# Patient Record
Sex: Female | Born: 1965
Health system: Southern US, Community
[De-identification: ages and names within clinical notes are randomized; demographics above are authoritative.]

## PROBLEM LIST (undated history)

## (undated) DIAGNOSIS — K5900 Constipation, unspecified: Secondary | ICD-10-CM

## (undated) DIAGNOSIS — IMO0001 Reserved for inherently not codable concepts without codable children: Secondary | ICD-10-CM

## (undated) DIAGNOSIS — M62838 Other muscle spasm: Secondary | ICD-10-CM

## (undated) DIAGNOSIS — N898 Other specified noninflammatory disorders of vagina: Secondary | ICD-10-CM

## (undated) DIAGNOSIS — R5381 Other malaise: Secondary | ICD-10-CM

## (undated) DIAGNOSIS — K047 Periapical abscess without sinus: Secondary | ICD-10-CM

## (undated) DIAGNOSIS — N946 Dysmenorrhea, unspecified: Secondary | ICD-10-CM

## (undated) DIAGNOSIS — G245 Blepharospasm: Secondary | ICD-10-CM

## (undated) DIAGNOSIS — E785 Hyperlipidemia, unspecified: Secondary | ICD-10-CM

## (undated) DIAGNOSIS — R102 Pelvic and perineal pain: Secondary | ICD-10-CM

## (undated) DIAGNOSIS — R5383 Other fatigue: Secondary | ICD-10-CM

## (undated) HISTORY — DX: Reserved for inherently not codable concepts without codable children: IMO0001

## (undated) HISTORY — DX: Pelvic and perineal pain: R10.2

## (undated) HISTORY — DX: Blepharospasm: G24.5

## (undated) HISTORY — DX: Other specified noninflammatory disorders of vagina: N89.8

## (undated) HISTORY — DX: Hyperlipidemia, unspecified: E78.5

## (undated) HISTORY — DX: Constipation, unspecified: K59.00

## (undated) HISTORY — DX: Other muscle spasm: M62.838

## (undated) HISTORY — DX: Dysmenorrhea, unspecified: N94.6

## (undated) HISTORY — DX: Periapical abscess without sinus: K04.7

## (undated) HISTORY — DX: Other malaise: R53.81

## (undated) HISTORY — DX: Other fatigue: R53.83

---

## 1996-02-05 HISTORY — PX: OTHER SURGICAL HISTORY: SHX169

## 2002-01-25 ENCOUNTER — Other Ambulatory Visit: Admission: RE | Admit: 2002-01-25 | Discharge: 2002-01-25 | Payer: Self-pay | Admitting: Internal Medicine

## 2002-07-01 ENCOUNTER — Other Ambulatory Visit: Admission: RE | Admit: 2002-07-01 | Discharge: 2002-07-01 | Payer: Self-pay | Admitting: Internal Medicine

## 2004-03-01 ENCOUNTER — Ambulatory Visit: Payer: Self-pay | Admitting: Family Medicine

## 2004-07-05 ENCOUNTER — Encounter (INDEPENDENT_AMBULATORY_CARE_PROVIDER_SITE_OTHER): Payer: Self-pay | Admitting: Internal Medicine

## 2004-07-05 HISTORY — PX: OTHER SURGICAL HISTORY: SHX169

## 2004-07-05 LAB — CONVERTED CEMR LAB: Pap Smear: NORMAL

## 2004-07-30 ENCOUNTER — Ambulatory Visit: Payer: Self-pay | Admitting: Family Medicine

## 2004-07-30 ENCOUNTER — Other Ambulatory Visit: Admission: RE | Admit: 2004-07-30 | Discharge: 2004-07-30 | Payer: Self-pay | Admitting: Family Medicine

## 2004-10-20 ENCOUNTER — Emergency Department: Payer: Self-pay | Admitting: Emergency Medicine

## 2004-12-12 ENCOUNTER — Ambulatory Visit: Payer: Self-pay | Admitting: Family Medicine

## 2005-05-22 ENCOUNTER — Ambulatory Visit: Payer: Self-pay | Admitting: Family Medicine

## 2005-06-25 ENCOUNTER — Ambulatory Visit: Payer: Self-pay | Admitting: Family Medicine

## 2006-04-11 ENCOUNTER — Ambulatory Visit: Payer: Self-pay | Admitting: Family Medicine

## 2006-08-20 ENCOUNTER — Ambulatory Visit: Payer: Self-pay | Admitting: Family Medicine

## 2006-08-22 LAB — CONVERTED CEMR LAB
CO2: 30 meq/L (ref 19–32)
Calcium: 9.3 mg/dL (ref 8.4–10.5)
Chloride: 104 meq/L (ref 96–112)
Creatinine, Ser: 0.7 mg/dL (ref 0.4–1.2)
Eosinophils Relative: 0.9 % (ref 0.0–5.0)
GFR calc non Af Amer: 100 mL/min
Glucose, Bld: 126 mg/dL — ABNORMAL HIGH (ref 70–99)
Monocytes Absolute: 0.3 10*3/uL (ref 0.2–0.7)
Neutro Abs: 4.3 10*3/uL (ref 1.4–7.7)
RBC: 4.35 M/uL (ref 3.87–5.11)
RDW: 12.5 % (ref 11.5–14.6)
TSH: 3.73 microintl units/mL (ref 0.35–5.50)
WBC: 7.1 10*3/uL (ref 4.5–10.5)

## 2006-09-02 ENCOUNTER — Encounter: Payer: Self-pay | Admitting: Internal Medicine

## 2006-09-02 DIAGNOSIS — E785 Hyperlipidemia, unspecified: Secondary | ICD-10-CM

## 2006-10-17 ENCOUNTER — Encounter (INDEPENDENT_AMBULATORY_CARE_PROVIDER_SITE_OTHER): Payer: Self-pay | Admitting: Internal Medicine

## 2006-10-17 ENCOUNTER — Ambulatory Visit: Payer: Self-pay | Admitting: Family Medicine

## 2006-10-17 ENCOUNTER — Telehealth (INDEPENDENT_AMBULATORY_CARE_PROVIDER_SITE_OTHER): Payer: Self-pay | Admitting: *Deleted

## 2006-10-17 ENCOUNTER — Other Ambulatory Visit: Admission: RE | Admit: 2006-10-17 | Discharge: 2006-10-17 | Payer: Self-pay | Admitting: Family Medicine

## 2006-10-17 LAB — CONVERTED CEMR LAB

## 2006-10-28 ENCOUNTER — Encounter (INDEPENDENT_AMBULATORY_CARE_PROVIDER_SITE_OTHER): Payer: Self-pay | Admitting: *Deleted

## 2007-10-23 ENCOUNTER — Ambulatory Visit: Payer: Self-pay | Admitting: Family Medicine

## 2007-11-19 ENCOUNTER — Ambulatory Visit: Payer: Self-pay | Admitting: Family Medicine

## 2007-11-19 DIAGNOSIS — K5900 Constipation, unspecified: Secondary | ICD-10-CM | POA: Insufficient documentation

## 2007-11-25 ENCOUNTER — Ambulatory Visit: Payer: Self-pay | Admitting: Family Medicine

## 2007-11-25 ENCOUNTER — Encounter (INDEPENDENT_AMBULATORY_CARE_PROVIDER_SITE_OTHER): Payer: Self-pay | Admitting: Internal Medicine

## 2007-11-25 ENCOUNTER — Other Ambulatory Visit: Admission: RE | Admit: 2007-11-25 | Discharge: 2007-11-25 | Payer: Self-pay | Admitting: Family Medicine

## 2007-11-25 LAB — CONVERTED CEMR LAB: Pap Smear: NORMAL

## 2007-11-27 LAB — CONVERTED CEMR LAB
ALT: 15 units/L (ref 0–35)
AST: 21 units/L (ref 0–37)
BUN: 18 mg/dL (ref 6–23)
Basophils Relative: 0.5 % (ref 0.0–3.0)
GFR calc non Af Amer: 99 mL/min
HCT: 38.3 % (ref 36.0–46.0)
HDL: 39.8 mg/dL (ref >39.0)
Hemoglobin: 13.4 g/dL (ref 12.0–15.0)
Lymphocytes Relative: 28.8 % (ref 12.0–46.0)
MCHC: 34.9 g/dL (ref 30.0–36.0)
MCV: 87.5 fL (ref 78.0–100.0)
Monocytes Absolute: 0.4 10*3/uL (ref 0.1–1.0)
RBC: 4.38 M/uL (ref 3.87–5.11)
Total CHOL/HDL Ratio: 4.8
Triglycerides: 115 mg/dL (ref 0–149)
VLDL: 23 mg/dL (ref 0–40)

## 2007-11-30 ENCOUNTER — Encounter (INDEPENDENT_AMBULATORY_CARE_PROVIDER_SITE_OTHER): Payer: Self-pay | Admitting: *Deleted

## 2008-06-13 ENCOUNTER — Ambulatory Visit: Payer: Self-pay | Admitting: Family Medicine

## 2008-06-13 LAB — CONVERTED CEMR LAB
Glucose, Urine, Semiquant: NEGATIVE
Nitrite: NEGATIVE
Specific Gravity, Urine: 1.02
Whiff Test: NEGATIVE
pH: 5

## 2008-06-16 ENCOUNTER — Ambulatory Visit: Payer: Self-pay | Admitting: Obstetrics and Gynecology

## 2008-06-17 ENCOUNTER — Encounter: Payer: Self-pay | Admitting: Family Medicine

## 2008-06-17 LAB — CONVERTED CEMR LAB
Clue Cells Wet Prep HPF POC: NONE SEEN
Trich, Wet Prep: NONE SEEN
Yeast Wet Prep HPF POC: NONE SEEN

## 2008-07-05 ENCOUNTER — Ambulatory Visit: Payer: Self-pay | Admitting: Obstetrics & Gynecology

## 2008-07-13 ENCOUNTER — Ambulatory Visit: Payer: Self-pay | Admitting: Obstetrics & Gynecology

## 2008-07-29 ENCOUNTER — Encounter: Payer: Self-pay | Admitting: Obstetrics & Gynecology

## 2008-07-29 ENCOUNTER — Ambulatory Visit: Payer: Self-pay | Admitting: Obstetrics & Gynecology

## 2008-07-29 ENCOUNTER — Ambulatory Visit (HOSPITAL_COMMUNITY): Admission: RE | Admit: 2008-07-29 | Discharge: 2008-07-29 | Payer: Self-pay | Admitting: Obstetrics & Gynecology

## 2008-09-16 ENCOUNTER — Inpatient Hospital Stay (HOSPITAL_COMMUNITY): Admission: AD | Admit: 2008-09-16 | Discharge: 2008-09-16 | Payer: Self-pay | Admitting: Obstetrics & Gynecology

## 2008-09-16 ENCOUNTER — Telehealth (INDEPENDENT_AMBULATORY_CARE_PROVIDER_SITE_OTHER): Payer: Self-pay | Admitting: Internal Medicine

## 2008-09-27 ENCOUNTER — Ambulatory Visit: Payer: Self-pay | Admitting: Family Medicine

## 2008-09-28 ENCOUNTER — Encounter: Payer: Self-pay | Admitting: Family Medicine

## 2008-09-28 LAB — CONVERTED CEMR LAB: Yeast Wet Prep HPF POC: NONE SEEN

## 2009-03-16 ENCOUNTER — Telehealth: Payer: Self-pay | Admitting: Family Medicine

## 2009-03-24 ENCOUNTER — Ambulatory Visit: Payer: Self-pay | Admitting: Internal Medicine

## 2009-09-05 ENCOUNTER — Ambulatory Visit: Payer: Self-pay | Admitting: Family Medicine

## 2009-09-05 ENCOUNTER — Telehealth (INDEPENDENT_AMBULATORY_CARE_PROVIDER_SITE_OTHER): Payer: Self-pay | Admitting: *Deleted

## 2009-09-05 LAB — CONVERTED CEMR LAB
ALT: 15 units/L (ref 0–35)
AST: 18 units/L (ref 0–37)
Bilirubin, Direct: 0 mg/dL (ref 0.0–0.3)
CO2: 29 meq/L (ref 19–32)
Calcium: 9.1 mg/dL (ref 8.4–10.5)
Cholesterol: 178 mg/dL (ref 0–200)
Glucose, Bld: 86 mg/dL (ref 70–99)
HDL: 38.2 mg/dL — ABNORMAL LOW (ref >39.00)
LDL Cholesterol: 113 mg/dL — ABNORMAL HIGH (ref 0–99)
Sodium: 141 meq/L (ref 135–145)

## 2009-09-08 ENCOUNTER — Other Ambulatory Visit: Admission: RE | Admit: 2009-09-08 | Discharge: 2009-09-08 | Payer: Self-pay | Admitting: Family Medicine

## 2009-09-08 ENCOUNTER — Ambulatory Visit: Payer: Self-pay | Admitting: Family Medicine

## 2009-09-08 DIAGNOSIS — R5383 Other fatigue: Secondary | ICD-10-CM

## 2009-09-08 DIAGNOSIS — N898 Other specified noninflammatory disorders of vagina: Secondary | ICD-10-CM | POA: Insufficient documentation

## 2009-09-08 DIAGNOSIS — R5381 Other malaise: Secondary | ICD-10-CM

## 2009-09-08 LAB — HM PAP SMEAR

## 2009-09-18 ENCOUNTER — Encounter (INDEPENDENT_AMBULATORY_CARE_PROVIDER_SITE_OTHER): Payer: Self-pay | Admitting: *Deleted

## 2009-09-19 ENCOUNTER — Ambulatory Visit: Payer: Self-pay | Admitting: Family Medicine

## 2009-09-19 ENCOUNTER — Encounter: Payer: Self-pay | Admitting: Family Medicine

## 2009-09-20 LAB — HM MAMMOGRAPHY: HM Mammogram: NORMAL

## 2010-02-16 ENCOUNTER — Ambulatory Visit
Admission: RE | Admit: 2010-02-16 | Discharge: 2010-02-16 | Payer: Self-pay | Source: Home / Self Care | Attending: Family Medicine | Admitting: Family Medicine

## 2010-02-16 DIAGNOSIS — K047 Periapical abscess without sinus: Secondary | ICD-10-CM | POA: Insufficient documentation

## 2010-02-16 DIAGNOSIS — G245 Blepharospasm: Secondary | ICD-10-CM | POA: Insufficient documentation

## 2010-02-16 DIAGNOSIS — M62838 Other muscle spasm: Secondary | ICD-10-CM | POA: Insufficient documentation

## 2010-02-25 ENCOUNTER — Encounter: Payer: Self-pay | Admitting: Family Medicine

## 2010-03-06 NOTE — Assessment & Plan Note (Signed)
Summary: cpx/ transfer from [facility]   Vital Signs:  Patient profile:   45 year old female Height:      59 inches Weight:      126.4 pounds BMI:     25.62 Temp:     98.7 degrees F oral Pulse rate:   84 / minute Pulse rhythm:   regular BP sitting:   110 / 70  (left arm) Cuff size:   regular  Vitals Entered By: Benny Lennert CMA Duncan Dull) (September 08, 2009 3:38 PM)  History of Present Illness: Chief complaint cpx with pap  The patient is here for annual wellness exam and preventative care.      She has the following additional concerns:  When in [place]...4 weeks ago..in area where the is a lot of tuberculsis No cough and no problems breathing. Has recieved vaccine for TB  Recent Mirena IUD placed 5-6 months ago..some vaginal discharge, menses irregular, spotting.  No vaginal itching.  Problems Prior to Update: 1)  Pharyngitis, Viral, Acute  (ICD-462) 2)  Iud  (ICD-V25.1) 3)  Rlq Pain  (ICD-789.03) 4)  Constipation  (ICD-564.00) 5)  Fungal Dermatitis  (ICD-111.9) 6)  Cellulitis, Leg, Right  (ICD-682.6) 7)  Vaginitis  (ICD-616.10) 8)  Well Adult  (ICD-V70.0) 9)  Hyperlipidemia  (ICD-272.4) 10)  Fatigue  (ICD-780.79)  Current Medications (verified): 1)  Daily Multiple Vitamins   Tabs (Multiple Vitamin) .... Take 1 Tablet By Mouth Once A Day 2)  Mirena 20 Mcg/24hr  Iud (Levonorgestrel) .... As Directed  Allergies: 1)  ! * Pain Med Doy Mince [place]  Past History:  Past medical, surgical, family and social histories (including risk factors) reviewed, and no changes noted (except as noted below).  Past Medical History: Reviewed history from 09/02/2006 and no changes required. Hyperlipidemia  Past Surgical History: Reviewed history from 11/25/2007 and no changes required. 6/06   Pelvic x-ray - IUD in place lasik surg both eyes 19998  Family History: Reviewed history from 09/02/2006 and no changes required. Father: Alive 74 ? TB Mother: Died 57, TB Siblings: 2  brothers - L & W                1 sister - L & W CV:  ?  Social History: Reviewed history from 11/25/2007 and no changes required. Never Smoked Alcohol use-no Drug use-no Marital Status: Married x a few months Children: 1 Occupation: Recently moved from [place] from [facility]  Review of Systems       Stress at work.  General:  Complains of fatigue. CV:  Denies chest pain or discomfort. Resp:  Denies shortness of breath. GI:  Denies abdominal pain, bloody stools, and constipation. GU:  Denies dysuria and hematuria. Derm:  Complains of lesion(s); denies rash; bug bites on torso.  Physical Exam  General:  Well-developed,well-nourished,in no acute distress; alert,appropriate and cooperative throughout examination Eyes:  No corneal or conjunctival inflammation noted. EOMI. Perrla. Funduscopic exam benign, without hemorrhages, exudates or papilledema. Vision grossly normal. Ears:  External ear exam shows no significant lesions or deformities.  Otoscopic examination reveals clear canals, tympanic membranes are intact bilaterally without bulging, retraction, inflammation or discharge. Hearing is grossly normal bilaterally. Nose:  External nasal examination shows no deformity or inflammation. Nasal mucosa are pink and moist without lesions or exudates. Mouth:  Oral mucosa and oropharynx without lesions or exudates.  Teeth in good repair. Neck:  No deformities, masses, or tenderness noted. Lungs:  Normal respiratory effort, chest expands symmetrically. Lungs are clear to  auscultation, no crackles or wheezes. Heart:  Normal rate and regular rhythm. S1 and S2 normal without gallop, murmur, click, rub or other extra sounds. Abdomen:  Bowel sounds positive,abdomen soft and non-tender without masses, organomegaly or hernias noted. Genitalia:  Pelvic Exam:        External: normal female genitalia without lesions or masses        Vagina: normal without lesions or masses        Cervix: normal without  lesions or masses, IUD string in place        Adnexa: normal bimanual exam without masses or fullness        Uterus: normal by palpation        Pap smear: performed Pulses:  R and L posterior tibial pulses are full and equal bilaterally  Extremities:  no edema  Skin:  Intact without suspicious lesions or rashes Psych:  Cognition and judgment appear intact. Alert and cooperative with normal attention span and concentration. No apparent delusions, illusions, hallucinations   Impression & Recommendations:  Problem # 1:  WELL ADULT (ICD-V70.0) The patient's preventative maintenance and recommended screening tests for an annual wellness exam were reviewed in full today. Brought up to date unless services declined.  Counselled on the importance of diet, exercise, and its role in overall health and mortality. The patient's FH and SH was reviewed, including their home life, tobacco status, and drug and alcohol status.     Problem # 2:  ROUTINE GYNECOLOGICAL EXAMINATION (ICD-V72.31) PAP pending.   Problem # 3:  HYPERLIPIDEMIA (ICD-272.4) Encouraged exercise, weight loss, healthy eating habits.  Labs Reviewed: SGOT: 18 (09/05/2009)   SGPT: 15 (09/05/2009)   HDL:38.20 (09/05/2009), 39.8 (11/25/2007)  LDL:113 (09/05/2009), 130 (11/25/2007)  Chol:178 (09/05/2009), 193 (11/25/2007)  Trig:136.0 (09/05/2009), 115 (11/25/2007)  Complete Medication List: 1)  Daily Multiple Vitamins Tabs (Multiple vitamin) .... Take 1 tablet by mouth once a day 2)  Mirena 20 Mcg/24hr Iud (Levonorgestrel) .... As directed  Other Orders: Wet Prep (60454UJ) Tdap => 49yrs IM (81191) Admin 1st Vaccine (47829) Admin 1st Vaccine (State) [phone]) Hgb (86578) Radiology Referral (Radiology)  Patient Instructions: 1)  [facility] 2)   [email or web address] disease [facility]...look up any country that you are going to..see what vaccine/protection needed.  3)  Please schedule a follow-up appointment in 1  year.  4)  Work on exercise and healthy eating. 5)     Current Allergies (reviewed today): ! * PAIN MED FRON [place]      Tetanus/Td Vaccine    Vaccine Type: Tdap    Site: left deltoid    Mfr: GlaxoSmithKline    Dose: 0.5 ml    Route: IM    Given by: Benny Lennert CMA (AAMA)    Exp. Date: 08/04/2011    Lot #: [id number]    VIS given: 12/23/06 version given September 08, 2009.   Laboratory Results   Blood Tests   Date/Time Recieved: September 08, 2009 4:43 PM  Date/Time Reported: September 08, 2009 4:43 PM   CBC HGB:  13.5 g/dL   (Normal Range: 13.2-44.0 in Males, 12.0-15.0 in Females)

## 2010-03-06 NOTE — Progress Notes (Signed)
----   Converted from flag ---- ---- 09/04/2009 5:27 PM, Kerby Nora MD wrote: Dx CMEt and LIPIDS Dx 272.0  ---- 09/04/2009 12:02 PM, Liane Comber CMA (AAMA) wrote: Pt is scheduled for cpx labs tomorrow, what labs to draw and dx codes? Thanks Tasha ------------------------------

## 2010-03-06 NOTE — Assessment & Plan Note (Signed)
Summary: DR BEAN PT @ [facility]---SORE SWOLLEN THROAT-STUFF ON TONGUE-FEV...   Vital Signs:  Patient profile:   45 year old female Height:      59 inches (149.86 cm) Weight:      127.0 pounds (57.73 kg) BMI:     25.74 O2 Sat:      97 % on Room air Temp:     97.3 degrees F (36.28 degrees C) oral Pulse rate:   83 / minute BP sitting:   112 / 72  (left arm) Cuff size:   regular  Vitals Entered By: Orlan Leavens (March 24, 2009 1:28 PM)  O2 Flow:  Room air CC: sore throat/ fever x's 2 days Is Patient Diabetic? No Pain Assessment Patient in pain? no        Primary Care Provider:  Shaune Leeks MD  CC:  sore throat/ fever x's 2 days.  History of Present Illness: here today with complaint of pain in mouth and throat. onset of symptoms was 2 days ago. course has been sudden onset and now occurs in constant pattern. problem precipitated by +sick contacts symptom characterized as pain and sore on edge of tounge - progressed into throat problem associated with odynophagia and LGF and malaise but not associated with HA, rash, confusion or cough - no nasal congestion or sputum. symptoms improved by use of nyquil last PM. symptoms worsened with efforts to eat or drink - "can't because it hurts". no prior hx of same symptoms.   Current Medications (verified): 1)  Daily Multiple Vitamins   Tabs (Multiple Vitamin) .... Take 1 Tablet By Mouth Once A Day 2)  Mirena 20 Mcg/24hr  Iud (Levonorgestrel) .... As Directed  Allergies (verified): 1)  ! * Pain Med Doy Mince [place]  Past History:  Past Medical History: Reviewed history from 09/02/2006 and no changes required. Hyperlipidemia  Review of Systems       The patient complains of anorexia.  The patient denies decreased hearing, hoarseness, chest pain, dyspnea on exertion, peripheral edema, and abdominal pain.    Physical Exam  General:  alert, well-developed, well-nourished, and cooperative to examination.   nontoxic - spouse  at side Eyes:  vision grossly intact; pupils equal, round and reactive to light.  conjunctiva and lids normal.    Ears:  normal pinnae bilaterally, without erythema, swelling, or tenderness to palpation. TMs clear, without effusion, or cerumen impaction. Hearing grossly normal bilaterally  Mouth:  teeth and gums in good repair; mucous membranes moist, without ulcers. oropharynx clear without exudate, mod - bright erythema.  +vesicles along left lateral tongue edge and uvula + left oropharynx Neck:  supple, full ROM, no masses, no thyromegaly; no thyroid nodules or tenderness. no JVD or carotid bruits.   Lungs:  normal respiratory effort, no intercostal retractions or use of accessory muscles; normal breath sounds bilaterally - no crackles and no wheezes.    Heart:  normal rate, regular rhythm, no murmur, and no rub. BLE without edema.  Neurologic:  alert & oriented X3 and cranial nerves II-XII symetrically intact.  strength normal in all extremities, sensation intact to light touch, and gait normal. speech fluent without dysarthria or aphasia; follows commands with good comprehension.  Skin:  no rashes, vesicles, ulcers, or erythema. No nodules or irregularity to palpation.    Impression & Recommendations:  Problem # 1:  PHARYNGITIS, VIRAL, ACUTE (ICD-462)  add emperic acyclovir - also viscous lidocaine for control of pain - advised use of tylenol and ibuprofen as needed  for other systemic viral symptoms  to call if worse/unimproved Her updated medication list for this problem includes:    Lidocaine Viscous 2 % Soln (Lidocaine hcl) .Marland KitchenMarland KitchenMarland KitchenMarland Kitchen 10 cc by mouth (swish and gargle - then may swallow or spit) 4x/day as needed for mouth and throat pain  Orders: Prescription Created Electronically [id number])  Complete Medication List: 1)  Daily Multiple Vitamins Tabs (Multiple vitamin) .... Take 1 tablet by mouth once a day 2)  Mirena 20 Mcg/24hr Iud (Levonorgestrel) .... As directed 3)  Acyclovir 400 Mg  Tabs (Acyclovir) .Marland Kitchen.. 1 by mouth 5x/day x 7 days 4)  Lidocaine Viscous 2 % Soln (Lidocaine hcl) .Marland Kitchen.. 10 cc by mouth (swish and gargle - then may swallow or spit) 4x/day as needed for mouth and throat pain  Patient Instructions: 1)  it was good to see you today. 2)  treat viral infection with acyclovir and viscous lidocaine for pain control - your prescriptions have been electronically submitted to your pharmacy. Please take as directed. Contact our office if you believe you're having problems with the medication(s).  3)  Take 650-1000mg  of Tylenol every 4-6 hours as needed for relief of pain or comfort of fever AVOID taking more than 4000mg   in a 24 hour period (can cause liver damage in higher doses). 4)  Take 400-600mg  of Ibuprofen (Advil, Motrin) with food every 4-6 hours as needed for relief of pain or comfort of fever. 5)  Drink clear liquids only for the next 24 hours, then slowly add other liquids and food as you  tolerate them. 6)  Please schedule a follow-up appointment as needed. Prescriptions: LIDOCAINE VISCOUS 2 % SOLN (LIDOCAINE HCL) 10 cc by mouth (swish and gargle - then may swallow or spit) 4x/day as needed for mouth and throat pain  #200cc x 0   Entered and Authorized by:   Newt Lukes MD   Signed by:   Newt Lukes MD on 03/24/2009   Method used:   Electronically to        [facility] [address] (retail)       [address]       [place], [place]  [address]       Ph: [phone]       Fax: [phone]   RxID:   [id number] ACYCLOVIR 400 MG TABS (ACYCLOVIR) 1 by mouth 5x/day x 7 days  #35 x 0   Entered and Authorized by:   Newt Lukes MD   Signed by:   Newt Lukes MD on 03/24/2009   Method used:   Electronically to        [facility] [address] (retail)       [address]       [place], [place]  [address]       Ph: [phone]       Fax: [phone]   RxID:    [id number]

## 2010-03-06 NOTE — Progress Notes (Signed)
Summary: wants referral to massage therapist  Phone Note Call from Patient Call back at [phone]   Caller: Patient Summary of Call: Pt wants referral to a massage therapist for her back and shoulder pain.  She prefers to go to [place], needs this to be covered by her insurance. Initial call taken by: Lowella Petties CMA,  March 16, 2009 3:38 PM  Follow-up for Phone Call        Will need to be seen for this here. May need to try physical therapy as massage therapy often not covered. Follow-up by: Shaune Leeks MD,  March 16, 2009 3:55 PM  Additional Follow-up for Phone Call Additional follow up Details #1::        Advised pt, she declined appt, said she would get massage therapy on her own. Additional Follow-up by: Lowella Petties CMA,  March 16, 2009 4:24 PM

## 2010-03-06 NOTE — Letter (Signed)
Summary: Results Follow up Letter  [place] [facility]  [address] [place], [place] [address]   Phone: [phone]  Fax: [phone]    09/18/2009 MRN: [id number]     Jennifer Mendez [address] [place], [place]  [address]     Dear [name],  The following are the results of your recent test(s):  Test         Result    Pap Smear:        Normal __x___  Not Normal _____ Comments:Repeat in 1 year ______________________________________________________ Cholesterol: LDL(Bad cholesterol):         Your goal is less than:         HDL (Good cholesterol):       Your goal is more than: Comments:  ______________________________________________________ Mammogram:        Normal _____  Not Normal _____ Comments:  ___________________________________________________________________ Hemoccult:        Normal _____  Not normal _______ Comments:    _____________________________________________________________________ Other Tests:    We routinely do not discuss normal results over the telephone.  If you desire a copy of the results, or you have any questions about this information we can discuss them at your next office visit.   Sincerely,  Kerby Nora MD

## 2010-03-08 NOTE — Assessment & Plan Note (Signed)
Summary: Problem with left eye ? nerve/spasm   Vital Signs:  Patient profile:   45 year old female Height:      59 inches Weight:      133.25 pounds BMI:     27.01 Temp:     98.4 degrees F oral Pulse rate:   84 / minute Pulse rhythm:   regular BP sitting:   100 / 60  (left arm) Cuff size:   regular  Vitals Entered By: Benny Lennert CMA Duncan Dull) (February 16, 2010 3:16 PM)  History of Present Illness: Chief complaint Problem with left eye spasm or nerve  left eye in last 4 weeks... has noted twithcing of lower lid, occ upper lid. Using computer daily... works on [employer]... on 10 hours a day. Fatgiue, no headache.  Swollen gum in right lower jaw x 3-4 days... moderately painful. 10 years ago had teeth removed there. Last dentist visit 3 months ago...had good exam and cleaning, nml X-ray .  No fever.Marland Kitchen occ hotat night.   Father passed away 2-3 days ago.     Right trapezius ttp, no shoulder or neck pain. No weakness, no tingling.   Problems Prior to Update: 1)  Routine Gynecological Examination  (ICD-V72.31) 2)  Other Screening Mammogram  (ICD-V76.12) 3)  Fatigue  (ICD-780.79) 4)  Vaginal Discharge  (ICD-623.5) 5)  Iud  (ICD-V25.1) 6)  Constipation  (ICD-564.00) 7)  Well Adult  (ICD-V70.0) 8)  Hyperlipidemia  (ICD-272.4)  Current Medications (verified): 1)  Daily Multiple Vitamins   Tabs (Multiple Vitamin) .... Take 1 Tablet By Mouth Once A Day 2)  Mirena 20 Mcg/24hr  Iud (Levonorgestrel) .... As Directed  Allergies: 1)  ! * Pain Med Doy Mince [place]  Past History:  Past medical, surgical, family and social histories (including risk factors) reviewed, and no changes noted (except as noted below).  Past Medical History: Reviewed history from 09/02/2006 and no changes required. Hyperlipidemia  Past Surgical History: Reviewed history from 11/25/2007 and no changes required. 6/06   Pelvic x-ray - IUD in place lasik surg both eyes 19998  Family History: Reviewed  history from 09/02/2006 and no changes required. Father: Alive 75 ? TB Mother: Died 84, TB Siblings: 2 brothers - L & W                1 sister - L & W CV:  ?  Social History: Reviewed history from 11/25/2007 and no changes required. Never Smoked Alcohol use-no Drug use-no Marital Status: Married x a few months Children: 1 Occupation: Recently moved from [place] from [employer]  Physical Exam  General:  Well-developed,well-nourished,in no acute distress; alert,appropriate and cooperative throughout examination Head:  Normocephalic and atraumatic without obvious abnormalities. No apparent alopecia or balding. Eyes:  No corneal or conjunctival inflammation noted. EOMI. Perrla. Funduscopic exam benign, without hemorrhages, exudates or papilledema. Vision grossly normal.Left eyelid spasming. Ears:  External ear exam shows no significant lesions or deformities.  Otoscopic examination reveals clear canals, tympanic membranes are intact bilaterally without bulging, retraction, inflammation or discharge. Hearing is grossly normal bilaterally. Nose:  External nasal examination shows no deformity or inflammation. Nasal mucosa are pink and moist without lesions or exudates. Mouth:  Oral mucosa and oropharynx without lesions or exudates.  right posterior lower gum.. tender, erythema, possible abcess.  Neck:  No carotid bruit or thyromegaly . Lungs:  Normal respiratory effort, chest expands symmetrically. Lungs are clear to auscultation, no crackles or wheezes. Heart:  Normal rate and regular rhythm. S1 and S2 normal  without gallop, murmur, click, rub or other extra sounds. Pulses:  R and L posterior tibial pulses are full and equal bilaterally  Extremities:  no edema    Impression & Recommendations:  Problem # 1:  BLEPHAROSPASM (ICD-333.81) Liekly due to eye strain from over use of computers.  If not improveing have eye exam by eye MD.   Problem # 2:  MUSCLE SPASM, TRAPEZIUS MUSCLE, RIGHT  (ICD-728.85) NSAIDS, heat, exercises.   Problem # 3:  ABSCESS, TOOTH (ICD-522.5) Treat with antibiotics.Marland Kitchen if not improving  see dentist.   Complete Medication List: 1)  Daily Multiple Vitamins Tabs (Multiple vitamin) .... Take 1 tablet by mouth once a day 2)  Mirena 20 Mcg/24hr Iud (Levonorgestrel) .... As directed 3)  Amoxicillin 500 Mg Caps (Amoxicillin) .... 2 tab by mouth two times a day x 10 days  Patient Instructions: 1)  Heat on neck, ibuprofen 800 mg every 8 hours for pain. 2)  Neck stretching exercsies. Massage neck/shoulder. 3)   Decrease computer use as able.  See eye MD if eye spasm continuing for eye exam.  4)  Complete antibitoics for presumed  tooth abcess.  Prescriptions: AMOXICILLIN 500 MG CAPS (AMOXICILLIN) 2 tab by mouth two times a day x 10 days  #40 x 0   Entered and Authorized by:   Kerby Nora MD   Signed by:   Kerby Nora MD on 02/16/2010   Method used:   Electronically to        [facility] [address] (retail)       [address]       [place], [place]  [address]       Ph: [phone]       Fax: [phone]   RxID:   [id number]    Orders Added: 1)  Est. Patient Level IV [66440]    Current Allergies (reviewed today): ! * PAIN MED FRON [place]

## 2010-05-14 LAB — PREGNANCY, URINE: Preg Test, Ur: NEGATIVE

## 2010-05-14 LAB — CBC
HCT: 38.8 % (ref 36.0–46.0)
MCV: 87.9 fL (ref 78.0–100.0)
Platelets: 150 10*3/uL (ref 150–400)
RBC: 4.42 MIL/uL (ref 3.87–5.11)
WBC: 6.9 10*3/uL (ref 4.0–10.5)

## 2010-05-17 ENCOUNTER — Ambulatory Visit (INDEPENDENT_AMBULATORY_CARE_PROVIDER_SITE_OTHER): Payer: Commercial Indemnity | Admitting: Obstetrics & Gynecology

## 2010-05-17 DIAGNOSIS — N946 Dysmenorrhea, unspecified: Secondary | ICD-10-CM

## 2010-05-17 DIAGNOSIS — Z30431 Encounter for routine checking of intrauterine contraceptive device: Secondary | ICD-10-CM

## 2010-05-17 DIAGNOSIS — N949 Unspecified condition associated with female genital organs and menstrual cycle: Secondary | ICD-10-CM

## 2010-05-17 NOTE — Assessment & Plan Note (Signed)
NAME:  HIYAB, NHEM NO.:  [id number]  MEDICAL RECORD NO.:  [id number]           PATIENT TYPE:  LOCATION:  [facility]           FACILITY:  PHYSICIAN:  Catalina Antigua, MD          DATE OF BIRTH:  DATE OF SERVICE:  05/17/2010                                 CLINIC NOTE  This is a 45 year old G3, P1-0-2-1 with a history of dysmenorrhea and pelvic pain who is status post IUD placement in 2010, who presents today for evaluation for 3-week vaginal bleeding status post IUD placement in 2010, as well as the presence of vaginal odor.  The patient states that since she has had the IUD placed, she has experienced a 3-week period which is light for which she only requires to wear a panty liner, but is not happy with the fact that she experiences vaginal bleeding for such a prolonged period of time.  She also reports the association of a vaginal odor despite her frequent cleansing.  The patient reports significant improvement in her pelvic pain and is only concerned of her prolonged vaginal bleeding.  PAST MEDICAL HISTORY:  She denies.  PAST SURGICAL HISTORY:  She denies.  PAST OBSTETRICAL HISTORY:  She has had 1 C-section.  PAST GYN HISTORY:  She denies any cyst, fibroids, or history of abnormal Pap smear.  SOCIAL HISTORY:  She denies any drinking, smoking, or the use of illicit drugs.  FAMILY HISTORY:  Significant for hypertension.  REVIEW OF SYSTEMS:  Otherwise, within normal limits.  PHYSICAL EXAMINATION:  VITAL SIGNS:  Her blood pressure is 118/88, pulse of 84, weight of 130 pounds. ABDOMEN:  Soft, nontender, nondistended. PELVIC:  She had normal vaginal mucosa and appearing cervix.  IUD strings were visualized at the external os.  No abnormal bleeding or discharge.  ASSESSMENT AND PLAN:  This is a 45 year old G3, P1-0-2-1, with IUD in place, who presents today for evaluation of 3-week monthly periods.  The patient states that her light bleeding  pattern which requires wearing a panty liner has a normal side effects at the depot of the IUD, however, we will give her Depo-Provera today to see if that will alleviate her symptoms.  A wet prep was also collected today.  The patient will be contacted with any abnormal results.  The patient will return in 4 months for followup on her vaginal bleeding.  An ultrasound was also ordered to confirm appropriate placement of the IUD.          ______________________________ Catalina Antigua, MD    PC/MEDQ  D:  05/17/2010  T:  05/17/2010  Job:  [id number]

## 2010-06-06 ENCOUNTER — Encounter: Payer: Self-pay | Admitting: Family Medicine

## 2010-06-08 ENCOUNTER — Encounter: Payer: Self-pay | Admitting: Family Medicine

## 2010-06-08 ENCOUNTER — Ambulatory Visit (INDEPENDENT_AMBULATORY_CARE_PROVIDER_SITE_OTHER): Payer: Commercial Indemnity | Admitting: Family Medicine

## 2010-06-08 DIAGNOSIS — M25511 Pain in right shoulder: Secondary | ICD-10-CM

## 2010-06-08 DIAGNOSIS — M25519 Pain in unspecified shoulder: Secondary | ICD-10-CM

## 2010-06-08 DIAGNOSIS — R229 Localized swelling, mass and lump, unspecified: Secondary | ICD-10-CM

## 2010-06-08 DIAGNOSIS — D492 Neoplasm of unspecified behavior of bone, soft tissue, and skin: Secondary | ICD-10-CM

## 2010-06-08 MED ORDER — CYCLOBENZAPRINE HCL 5 MG PO TABS: 5.0000 mg | ORAL_TABLET | Freq: Two times a day (BID) | ORAL | Status: DC | PRN

## 2010-06-08 MED ORDER — NAPROXEN 500 MG PO TABS: 500.0000 mg | ORAL_TABLET | Freq: Two times a day (BID) | ORAL | Status: DC

## 2010-06-08 NOTE — Progress Notes (Signed)
  Subjective:    Patient ID: Jennifer Mendez, female    DOB: 03-05-1965, 45 y.o.   MRN: [id number]  HPI CC: check head and shoulder  2 issues today.  Check spot on head x 7-8 mo, growing.  Bleeds when scratched, not painful.  Shoulder pain - almost 2 years now, getting worse.  Worse with travel/sitting in flight.  Pain at shoulder blade.  Heating pad helps pain but only temporary.  Also tried tylenol/alleve.  Makes phone calls all day, uses phone on right side.    Also feels cold continuously, making shoulder worse.  + R upper shoulder feels numb occasionally  No pain into arm or up neck.  No weakness.  Denies injury/trauma.  Review of Systems Per HPI    Objective:   Physical Exam  Constitutional: She is oriented to person, place, and time. She appears well-developed and well-nourished. No distress.  HENT:  Head: Normocephalic and atraumatic.  Mouth/Throat: Oropharynx is clear and moist. No oropharyngeal exudate.  Musculoskeletal:       FROM shoulders.  FROM neck.  Neg spurling.  No midline spine tenderness. R scapula doesn't move as well as L scapula. Tender to palpation rhomboid and trapezius muscles on R. Weakness 2/2 pain with testing of SITS on right esp supraspinatus.  Neurological: She is alert and oriented to person, place, and time. No sensory deficit.  Skin:       Exophytic growth L parietal scalp, nontender.          Assessment & Plan:

## 2010-06-08 NOTE — Assessment & Plan Note (Signed)
Some scapular dyskinesis along with rhomboid strain and likely RTC tendinopathy. Treat with NSAIDs, flexeril, rest, ice/heat.  Stretching/strengthening exercises as well.   Referral to PT per patient preference.

## 2010-06-08 NOTE — Patient Instructions (Signed)
Return at your convenience for office visit to remove growth. For shoulder - take naprosyn twice daily with food for 7 days as well as flexeril muscle relaxant.  Referral to physical therapy for further management.  Sounds like either rhomboid muscle strain or rotator cuff tendinopathy.  Do stretching exercises provided today.  Pass by Marion's office for referral to PT. For throat - salt water gargles.  If not better, notify us.

## 2010-06-08 NOTE — Assessment & Plan Note (Signed)
Unclear etiology.  Not consistent with pyogenic granuloma.  ? dermatofibroma Return Monday for excisional punch biopsy.  Small enough for likely 4mm.

## 2010-06-11 ENCOUNTER — Ambulatory Visit (INDEPENDENT_AMBULATORY_CARE_PROVIDER_SITE_OTHER): Payer: Commercial Indemnity | Admitting: Family Medicine

## 2010-06-11 ENCOUNTER — Encounter: Payer: Self-pay | Admitting: Family Medicine

## 2010-06-11 ENCOUNTER — Other Ambulatory Visit: Payer: Self-pay | Admitting: Family Medicine

## 2010-06-11 VITALS — BP 110/78 | HR 74 | Temp 98.5°F

## 2010-06-11 DIAGNOSIS — R229 Localized swelling, mass and lump, unspecified: Secondary | ICD-10-CM

## 2010-06-11 DIAGNOSIS — D492 Neoplasm of unspecified behavior of bone, soft tissue, and skin: Secondary | ICD-10-CM

## 2010-06-11 NOTE — Patient Instructions (Signed)
Biopsy Information A biopsy is the removal of tissue from the body. The tissue is looked at under a microscope to see what is wrong. Tissue can be removed in several different ways. A surgical biopsy means a piece of tissue is removed by minor surgery. Stitches may be used to close the skin. Special needles and other instruments can also be used to get small pieces of tissue.  Most biopsy specimens are examined by specialists. This is done after the tissue is specially prepared. Small slices are then placed on slides and looked at under a microscope. Most of the time, this process takes 3 days to complete the tissue preparation and exam. Pathologists (the specialist) are usually able to give a diagnosis after looking at the slides. Call your caregiver if you have questions about your biopsy procedure or the results. Make sure you know when and how you are supposed to get your results. Do not assume everything is fine if you do not hear from your caregiver. Document Released: 01/21/2005 Document Re-Released: 08/04/2006 [facility] Patient Information 2011 [place], [place].  If you haven't heard from [place] by next week ,call us. Keep area clean and dry, may use abx ointment on lesion. Watch for worsening pain or draining pus. If that happens, return to be seen.

## 2010-06-11 NOTE — Progress Notes (Signed)
  Subjective:    Patient ID: Jennifer Mendez, female    DOB: 10/03/1965, 45 y.o.   MRN: [id number]  HPI Here for biopsy of skin lesion (see last week visit for specifics).  Indications: bleeds, sometimes tender, uncomfortable.  Changing.  Present for several years.   Review of Systems     Objective:   Physical Exam Exophytic crusty lesion L parietal scalp measuring 5 mm in diameter    IC obtained and in chart.  Area cleaned with EtOH.  1cc buffered lidocaine with epi/bicarb used to numb area.  Growth too large for 4mm punch biopsy, no 5mm available so excised with shave biopsy.  Bleeding controlled with silver nitrate stick.  Dressed with abx ointment and bandaid.  Wound care information provided.   Assessment & Plan:

## 2010-06-11 NOTE — Assessment & Plan Note (Signed)
Unclear etiology.  ? pyogenic granuloma.  ? dermatofibroma Removed today via shave biopsy.  Await pathology.

## 2010-06-15 ENCOUNTER — Encounter: Payer: Self-pay | Admitting: Family Medicine

## 2010-06-19 NOTE — Op Note (Signed)
NAME:  Jennifer, Mendez NO.:  [id number]   MEDICAL RECORD NO.:  [id number]          PATIENT TYPE:  POB   LOCATION:  [facility]                          FACILITY:  [facility]   PHYSICIAN:  Horton Chin, MD DATE OF BIRTH:  1966-01-22   DATE OF PROCEDURE:  07/29/2008  DATE OF DISCHARGE:  07/13/2008                               OPERATIVE REPORT   PREOPERATIVE DIAGNOSIS:  Retained ring intrauterine device for 14 years,  desires further contraception.   POSTOPERATIVE DIAGNOSIS:  Retained ring intrauterine device for 14  years, desires further contraception.   PROCEDURE:  Operative hysteroscopy, removal of ring intrauterine device,  and insertion of a Mirena intrauterine device.   SURGEON:  Horton Chin, MD   ANESTHESIOLOGIST:  Angelica Pou, MD   ANESTHESIA:  General LMA.   INDICATIONS:  The patient is a 45 year old gravida 3, para 1-0-2-1, who  had a ring IUD inserted after her last delivery 14 years ago.  The  patient has increasing dysmenorrhea and attributed this to this ring  IUD.  An attempt was made to remove the IUD in the office after the  patient used Cytotec to aid with cervical dilatation, however, this was  not possible as the IUD was palpated to be embedded into the  endometrium.  Given the difficultyduring the attempt in the office, the  patient was counseled about removal in the operating room with the help  of hysteroscopy.  The risks of surgery were explained to the patient  including bleeding, infection, injury to surrounding organs, need for  additional procedures, and written informed consent was obtained.  Of  note, the patient did desire further contraception and wanted insertion  of a Mirena IUD at the same time.  The risk of the Mirena IUD were also  explained to the patient including the efficacy of 99.8% and increased  risk of ectopic gestation if pregnancy does occur in the event of  failure.  The patient was told that the Mirena  IUD will remain in place  for 5 years.   FINDINGS:  Embedded ring IUD high in the fundus.  Normal endometrial  cavity.  Normal ostia bilaterally.  The uterine cavity sounded to 8 cm.  Normal overall pelvic examination.  After the ring IUD was removed, the  Mirena IUD was reinserted.  Strings were cut to 3 cm outside external  os.   SPECIMENS:  None.   IV FLUIDS:  600 mL of lactated Ringer's.   SORBITOL DEFICIT:  60 mL.   ESTIMATED BLOOD LOSS:  Minimal.   PROCEDURE DETAILS:  The patient was taken to the operating room, where  general analgesia in the form of LMA was administered and found to be  adequate.  She was then placed in the dorsal lithotomy position and  prepped and draped in a sterile manner.  Her bladder was straight  catheterized for an unmeasured amount of clear yellow urine.  Attention  was then turned to the patient's pelvis, where a vaginal speculum was  placed and a tenaculum was placed in the anterior lip of the cervix.  A  paracervical block using 30 mL of 0.25% Marcaine was administered and  metal dilators were used to dilate the cervix to accommodate the  operative hysteroscope.  The operative hysteroscope was inserted into  the uterine cavity using sorbitol as a distention medium.  The uterine  cavity was carefully surveyed and the ring IUD was noted to be in the  top part of the fundus.  At this point, a grasper was placed through the  operative port and used to grasp the ring IUD.  Using the grasper, the  ring IUD was able to be removed from the surrounding endometrium and  myometrium and able to be removed from the uterine cavity.  There was  minimal amount of bleeding noted.  The uterine cavity was then  resurveyed and the ring was noted to be completely removed.  The ring  was also inspected on the operative table and noted to be intact but was  sent over to pathology for identification.  The uterus was cleared of  all the sorbitol and then at this point  was sounded to 8 cm in length.  The Mirena IUD was then inserted in the usual fashion.  This was an  uncomplicated Mirena IUD insertion and the IUD strings were cut to about  3 cm outside the external os.  Overall, good hemostasis was noted.  The  tenaculum was removed from the patient's cervix and all instruments were  removed from the patient's pelvis.  The patient tolerated the procedure  well.  Sponge and instrument counts were correct x2.  She was taken to  the recovery room awake, extubated, and in stable condition.   FOLLOWUP PLANS:  The patient was given prescriptions for Percocet,  Ibuprofen, and Colace, and was instructed to call the [facility] to make an appointment in about 3 - 4 weeks.  Routine  postoperative instructions were given to the patient and she was told to  call to [facility] or come to the [facility] for any further postoperative  concerns.      Horton Chin, MD  Electronically Signed     UAA/MEDQ  D:  07/29/2008  T:  07/30/2008  Job:  [id number]

## 2010-06-19 NOTE — Assessment & Plan Note (Signed)
NAME:  Jennifer Mendez, Jennifer Mendez NO.:  [id number]   MEDICAL RECORD NO.:  [id number]          PATIENT TYPE:  POB   LOCATION:  [facility]         FACILITY:  [facility]   PHYSICIAN:  Johnella Moloney, MD        DATE OF BIRTH:  03-24-1965   DATE OF SERVICE:  07/13/2008                                  CLINIC NOTE   CHIEF COMPLAINT:  Retained ring IUD for 14 years.   HISTORY OF PRESENT ILLNESS:  The patient is a 45 year old, gravida 3,  para 1-0-2-1, who had a ring IUD inserted after her last delivery 14  years ago.  The patient has had increasing dysmenorrhea and is  interested in removing the IUD.  At her last visit, the patient was  given a prescription for Cytotec 200 mcg and told to place per vagina  for 2 nights prior to the appointment today for an attempt to remove the  IUD.  Today she reports that she used the Cytotec as prescribed and she  also reports no other gynecologic complaints.   PAST MEDICAL HISTORY:  Hypercholesterinemia.   PAST SURGICAL HISTORY:  Cesarean section.   PAST OB/GYN HISTORY:  Regular menstrual periods with associated  dysmenorrhea lasted for 5 days.  The patient has a ring IUD in place.  Her last Pap smear was in December 2009 and was normal.  She denies any  cervical dysplasia.  The patient has never had a mammogram.   MEDICATIONS:  None, just IUD in place.   ALLERGIES:  No known drug allergies.  The patient is not allergic to  LATEX.   SOCIAL HISTORY:  The patient lives with her family.  She denies any  habits.  Denies any past or current history of abuse.   FAMILY HISTORY:  Notable for heart disease.  She denies any gynecologic,  colon, or breast cancer.   PHYSICAL EXAMINATION:  VITAL SIGNS:  Pulse 68, blood pressure 106/72,  weight 226 pounds, height 5 feet.  GENERAL:  No apparent distress.  ABDOMEN:  Soft, nontender, and nondistended.  PELVIC:  Normal external female genitalia.  Pink, well rugated vagina.  Normal discharge.   Normal cervical contour.   At times of IUD removal, the patient's cervix was swabbed with Betadine  x2 and a tenaculum was placed in the anterior lip.  A paracervical block  using 1% Xylocaine with epinephrine was administered.  A total of 20 mLs  was used.  Her cervix was dilated with disposable dilators and Kelly  forcep was introduced into the uterine cavity.  The ring forceps was  able to be palpated with a Tresa Endo, but was unable to be grasped after  multiple attempts.  On palpation of the IUD within the endometrial  cavity, it seems like that was pretty embedded into the myometrium and  could not be fully grasped with a Kelly forceps.  After further  attempts, the decision was made to stop the procedure.  The patient had  a minimal blood loss and was not uncomfortable during the procedure.  All instruments were then removed from the patient's vagina.   ASSESSMENT AND PLAN:  The patient is a 45 year old, gravida 3, para  1-0-  2-1, who is here today for an intrauterine (contraceptive) device  removal.  The patient does have a ring intrauterine (contraceptive)  device that has been in place for 14 years and this is concerning for  possible embedment into the myometrium.  This was unable to be removed  in office.  The patient was told that she will be booked for procedure  in the operating room for a dilation, removal of the ring intrauterine  (contraceptive) device and re-insertion of a Mirena intrauterine  (contraceptive) device as the patient is requesting this.  The risks of  surgery were reviewed with the patient and the patient was told that  this was an outpatient procedure and she agreed with plan.  The surgery  was booked for July 29, 2008, at 2:00 p.m. and this time and date was  communicated with the patient.  She was told to come in for any further  gynecologic concerns prior to the surgery.           ______________________________  Johnella Moloney, MD     UD/MEDQ  D:   07/13/2008  T:  07/14/2008  Job:  [id number]

## 2010-06-19 NOTE — Assessment & Plan Note (Signed)
NAME:  Jennifer Mendez, EBLEN NO.:  [id number]   MEDICAL RECORD NO.:  [id number]          PATIENT TYPE:  POB   LOCATION:  [facility]         FACILITY:  [facility]   PHYSICIAN:  Argentina Donovan, MD        DATE OF BIRTH:  1966/01/11   DATE OF SERVICE:  06/16/2008                                  CLINIC NOTE   The patient is a 45 year old English-speaking [place] female gravida 3,  para 1-0-2-1 whose child is 59 years old, and shortly after delivery by  cesarean section in [place], she had a ring IUD inserted.  It has been  there ever since.  She has had fairly regular periods, but over the past  year or so, the periods have gotten more uncomfortable with severe  cramping and a darkest discharge following the period with what she  described as an odor.  One week ago, she had the onset of sharp right  lower quadrant pain that lasted about 4 days and then weaned off which  judging from her last period was about mid cycle.  The pain at the  present time has gone.  She has had a Pap smear within the last few  months.   On examination, her abdomen is soft, flat, nontender.  No masses or  organomegaly and a vertical surgical scar subumbilical to the symphysis  pubis.  External genitalia is normal.  BUS within normal limits.  Vagina  is clean and well rugated.  The cervix is clean, stenotic, and  nulliparous.  Wet prep was taken and the uterus is of normal size,  shape, consistency, and the adnexa palpates normally.  Cul-de-sac is  free.  There is no positive whiff sign.   My impression at this time is that the patient probably had  Mittelschmerz, but this does not explain her recent onset of  dysmenorrhea after 14 years of IUD had been inserted and she said it was  inserted with difficulty.  There is no sign of any string for removal,  and with her cervical stenosis, I am fairly certain that she would need  probably a D&C and operating room dilatation to remove the IUD,  although  it certainly could be attempted to try her on some Cytotec first.  I am  going to get an ultrasound to rule out anything that I may have missed  in the pelvis.  I am not terribly concerned about that onset of that  sharp pain that she had; however, the dysmenorrhea and the abnormal  bleeding and discharge following her period may take away one of her  early warning signals for the future as she approaches the  perimenopausal time for endometrial cancer.  So, we are going to get the  ultrasound, bring her back in 2 weeks, and then decide whether or not  the IUD should be removed, make sure there is nothing else that shows  up.   IMPRESSION:  Dysmenorrhea, right lower quadrant pain, resolved.           ______________________________  Argentina Donovan, MD     PR/MEDQ  D:  06/16/2008  T:  06/17/2008  Job:  [id number]

## 2010-06-19 NOTE — Assessment & Plan Note (Signed)
NAME:  Jennifer Mendez, STIPES NO.:  [id number]   MEDICAL RECORD NO.:  [id number]          PATIENT TYPE:  POB   LOCATION:  [facility]         FACILITY:  [facility]   PHYSICIAN:  Jaynie Collins, MD     DATE OF BIRTH:  11-Jun-1965   DATE OF SERVICE:  09/27/2008                                  CLINIC NOTE   The patient is a 45 year old gravida 3, para 1-0-2-1 status post removal  of a retained metallic loop IUD inserted after her delivery 14 years ago  and Mirena IUD placement.  The removal of the IUD and the replacement of  the Mirena IUD was done on July 29, 2008.  Due to traveling, the patient  was unable to come back for her IUD check today.  She reports that she  has been having some irregular bleeding and some abdominal cramping,  especially on the right side when her bleeding occurs and she has had  this irregular bleeding since the IUD placement.  The patient also  reports some dyspareunia, but that has gotten better with time.  She  denies any other concerns at this point.   PHYSICAL EXAMINATION:  VITAL SIGNS:  Pulse is 75, blood pressure 108/74,  weight 126.5 pound, height 5 feet.  GENERAL:  No apparent distress.  ABDOMEN:  Soft, nontender, nondistended.  PELVIC:  Normal external female genitalia.  Pink well rugated vagina.  The patient has some thick yellow discharge that was noted in the  vaginal vault and a sample was obtained for wet prep.  Normal cervical  contour.  IUD string is visualized about 3 cm from the external os.  On  bimanual exam, the patient does have some tenderness on palpation of her  right ovary, but her right ovary and her left ovary are symmetric in  size and no abnormal masses are palpated.  No uterine tenderness.   ASSESSMENT AND PLAN:  The patient is a 45 year old gravida 3, para 1-0-2-  1 here for an IUD check.  The patient does complain about irregular  bleeding and cramping, especially on her right side.  The patient was  told  that these were normal side effects with the Mirena IUD or any IUD  placement for the first 6 months and the side effects of Mirena IUD were  re-reviewed with the patient.  She was also given the handout for Mirena  IUD to review at home.  She was told to come back if the bleeding  worsens or increases in volume.  Bleeding precautions were strictly  reviewed.  The patient does say that her pain is alleviated by naproxen  and she was told to continue taking this as needed.  She was told that  the symptoms will get better with time and that her bleeding will also  stabilize when her body gets used to the Mirena IUD and this is what  happens after 3-6 months transition.  If her pain continues on her right  side, the patient was told to call back and she will be scheduled for  pelvic ultrasound.  Otherwise, she had a normal IUD check today.  ______________________________  Jaynie Collins, MD     UA/MEDQ  D:  09/27/2008  T:  09/28/2008  Job:  [id number]

## 2010-06-25 ENCOUNTER — Ambulatory Visit: Payer: Commercial Indemnity | Admitting: Family Medicine

## 2010-06-26 ENCOUNTER — Ambulatory Visit: Payer: Commercial Indemnity | Admitting: Family Medicine

## 2010-07-06 ENCOUNTER — Ambulatory Visit (INDEPENDENT_AMBULATORY_CARE_PROVIDER_SITE_OTHER): Payer: Commercial Indemnity | Admitting: Family Medicine

## 2010-07-06 ENCOUNTER — Encounter: Payer: Self-pay | Admitting: Family Medicine

## 2010-07-06 VITALS — BP 102/70 | HR 69 | Temp 97.9°F | Ht 61.0 in | Wt 127.1 lb

## 2010-07-06 DIAGNOSIS — R5383 Other fatigue: Secondary | ICD-10-CM

## 2010-07-06 DIAGNOSIS — R5381 Other malaise: Secondary | ICD-10-CM

## 2010-07-06 LAB — CBC WITH DIFFERENTIAL/PLATELET
Basophils Relative: 0.3 % (ref 0.0–3.0)
Eosinophils Absolute: 0 10*3/uL (ref 0.0–0.7)
Eosinophils Relative: 0.6 % (ref 0.0–5.0)
Hemoglobin: 13.6 g/dL (ref 12.0–15.0)
Lymphocytes Relative: 41.6 % (ref 12.0–46.0)
MCHC: 34.2 g/dL (ref 30.0–36.0)
Neutro Abs: 3.6 10*3/uL (ref 1.4–7.7)
RBC: 4.41 Mil/uL (ref 3.87–5.11)
WBC: 6.9 10*3/uL (ref 4.5–10.5)

## 2010-07-06 NOTE — Progress Notes (Signed)
  Subjective:    Patient ID: Jennifer Mendez, female    DOB: December 06, 1965, 45 y.o.   MRN: [id number]  HPI 45 year old female presents with several months of feeling cold all the time. Continued fatigue.  No unexpected weight gain.  Has lost a few pound, trying to exercise as able. No problems with constipation.  Some dry skin, no hair loss  Has IUD in place .Marland Kitchen Causing small constant bleeding x 2 years.   Pain in right shoulder.. . Currently in PT.  Helping some gradually.    Review of Systems  Constitutional: Positive for fatigue. Negative for fever.  HENT: Negative for ear pain.   Eyes: Negative for pain.  Respiratory: Negative for chest tightness and shortness of breath.   Cardiovascular: Negative for chest pain, palpitations and leg swelling.  Gastrointestinal: Negative for abdominal pain.  Genitourinary: Negative for dysuria.       Objective:   Physical Exam  Constitutional: Vital signs are normal. She appears well-developed and well-nourished. She is cooperative.  Non-toxic appearance. She does not appear ill. No distress.  HENT:  Head: Normocephalic.  Right Ear: Hearing, tympanic membrane, external ear and ear canal normal. Tympanic membrane is not erythematous, not retracted and not bulging.  Left Ear: Hearing, tympanic membrane, external ear and ear canal normal. Tympanic membrane is not erythematous, not retracted and not bulging.  Nose: No mucosal edema or rhinorrhea. Right sinus exhibits no maxillary sinus tenderness and no frontal sinus tenderness. Left sinus exhibits no maxillary sinus tenderness and no frontal sinus tenderness.  Mouth/Throat: Uvula is midline, oropharynx is clear and moist and mucous membranes are normal.  Eyes: Conjunctivae, EOM and lids are normal. Pupils are equal, round, and reactive to light. No foreign bodies found.       Conjunctiva not pale  Neck: Trachea normal and normal range of motion. Neck supple. Carotid bruit is not present. No mass  and no thyromegaly present.  Cardiovascular: Normal rate, regular rhythm, S1 normal, S2 normal, normal heart sounds, intact distal pulses and normal pulses.  Exam reveals no gallop and no friction rub.   No murmur heard. Pulmonary/Chest: Effort normal and breath sounds normal. Not tachypneic. No respiratory distress. She has no decreased breath sounds. She has no wheezes. She has no rhonchi. She has no rales.  Abdominal: Soft. Normal appearance and bowel sounds are normal. There is no tenderness.  Neurological: She is alert.  Skin: Skin is warm, dry and intact. No rash noted.  Psychiatric: Her speech is normal and behavior is normal. Judgment and thought content normal. Her mood appears not anxious. Cognition and memory are normal. She does not exhibit a depressed mood.          Assessment & Plan:

## 2010-07-06 NOTE — Assessment & Plan Note (Signed)
Eval for thyroid, vit def and anemia.

## 2010-07-06 NOTE — Patient Instructions (Signed)
We will call with lab results   

## 2010-08-05 ENCOUNTER — Encounter: Payer: Self-pay | Admitting: Family Medicine

## 2010-10-19 ENCOUNTER — Encounter: Payer: Self-pay | Admitting: Family Medicine

## 2010-10-19 ENCOUNTER — Ambulatory Visit (INDEPENDENT_AMBULATORY_CARE_PROVIDER_SITE_OTHER): Payer: Commercial Indemnity | Admitting: Family Medicine

## 2010-10-19 VITALS — BP 104/72 | HR 76 | Temp 98.6°F | Wt 129.5 lb

## 2010-10-19 DIAGNOSIS — M25569 Pain in unspecified knee: Secondary | ICD-10-CM

## 2010-10-19 DIAGNOSIS — M25561 Pain in right knee: Secondary | ICD-10-CM

## 2010-10-19 MED ORDER — NAPROXEN 500 MG PO TABS: ORAL_TABLET | ORAL | Status: DC

## 2010-10-19 NOTE — Patient Instructions (Addendum)
I think you have inflammation of patellar tendon - do stretching exercises provided. Prescription strength anti inflammatory to use daily for next 5 days with food, then just as needed. Let us know if not improving as expected. For flight - important to get up and walk throughout flight.

## 2010-10-19 NOTE — Progress Notes (Addendum)
  Subjective:    Patient ID: Jennifer Mendez, female    DOB: July 11, 1965, 45 y.o.   MRN: [id number]  HPI CC: knee pain  2 mo h/o knee pain.  Denies inciting trauma/injury.  Started with sitting prolonged period of time.  Knees feel weak.  R>L.  Tried tylenol which helps some, but then pain comes back.  Also tried icy hot which helps.  No instability or locking of knees with walking.  Knees do crack occasionally.  Does tend to travel long distances in flight.  Sometimes travels to [place].  Has mirena in place.  Pain worse when sitting prolonged period of time.  Also worse when walking upstairs.  Pain described anterior kneecap sometimes radiates to anterior thigh.  No fevers/chills, redness, warmth or swelling.  Review of Systems Per HPI    Objective:   Physical Exam  Nursing note and vitals reviewed. Constitutional: She appears well-developed and well-nourished. No distress.  Musculoskeletal: She exhibits no edema.       Right knee: She exhibits normal range of motion, no swelling, normal alignment, normal patellar mobility and normal meniscus. tenderness found. Patellar tendon tenderness noted. No medial joint line, no lateral joint line, no MCL and no LCL tenderness noted.       Left knee: Normal. She exhibits normal range of motion and no swelling.       Neg drawer test bilaterally.  No pain with valgus/varus.  Neg mcmurray's. FROM flexion/extension.  No crepitus. Mild patellofemoral grind on right.  Some decreased VMO mass bilaterally.  No abnormal patellar alignment or mobility.  Foot exam - overall normal alignment, suspicious for mild pes planus R>L.  No palpable cords in calves or calf pain.  Skin: Skin is warm and dry. No rash noted.  Psychiatric: She has a normal mood and affect.          Assessment & Plan:

## 2010-10-19 NOTE — Assessment & Plan Note (Signed)
No evidence of DVT on exam today. More consistent with MSK issue, suspicious for patellofemoral pain syndrome. Treat with strengthening exercises, discussed isolated leg raise to strengthen VMO. NSAID as needed as well as icy/hot and heating pad. To return if not improved, consider orthotic vs SM referral.

## 2010-11-19 ENCOUNTER — Telehealth: Payer: Self-pay | Admitting: *Deleted

## 2010-11-19 DIAGNOSIS — M25561 Pain in right knee: Secondary | ICD-10-CM

## 2010-11-19 NOTE — Telephone Encounter (Signed)
Please check with pt about rheum referral.  I'd recommend sports medicine or orthopedics referral.  If wants sports medicine, we can refer to [facility] doc here, or can refer to [place].

## 2010-11-19 NOTE — Telephone Encounter (Signed)
Pt states her knee is not any better.  She was unable to take the naprosyn because it made her sick.  Since she's not better she would like a referral to a rhematologist in [place].  Please let her know.

## 2010-11-20 NOTE — Telephone Encounter (Signed)
Spoke with patient. She will see ortho or sport med. She prefers someone in [place] and requests a Wednesday or Friday appointment. I told her to expect a call from [place]. She verbalized understanding.

## 2010-11-20 NOTE — Telephone Encounter (Signed)
Have placed referral in chart for ortho/SM in [place]. Routed to [place].

## 2010-11-21 NOTE — Telephone Encounter (Signed)
Appt made with Dr Thurston Hole on 11/28/2010, patient aware of appt. MK

## 2011-01-22 ENCOUNTER — Ambulatory Visit: Payer: Self-pay | Admitting: Orthopedic Surgery

## 2011-03-08 ENCOUNTER — Ambulatory Visit: Payer: Self-pay | Admitting: Physical Medicine and Rehabilitation

## 2011-05-16 ENCOUNTER — Telehealth: Payer: Self-pay

## 2011-05-16 ENCOUNTER — Encounter: Payer: Self-pay | Admitting: Family Medicine

## 2011-05-16 ENCOUNTER — Ambulatory Visit
Admission: RE | Admit: 2011-05-16 | Discharge: 2011-05-16 | Disposition: A | Discharge status: Home / Self Care | Payer: BC Managed Care – PPO | Source: Ambulatory Visit | Attending: Family Medicine | Admitting: Family Medicine

## 2011-05-16 ENCOUNTER — Ambulatory Visit (INDEPENDENT_AMBULATORY_CARE_PROVIDER_SITE_OTHER): Payer: Commercial Indemnity | Admitting: Family Medicine

## 2011-05-16 VITALS — BP 102/70 | HR 70 | Temp 98.4°F | Ht 60.0 in | Wt 132.0 lb

## 2011-05-16 DIAGNOSIS — R4701 Aphasia: Secondary | ICD-10-CM

## 2011-05-16 DIAGNOSIS — R5383 Other fatigue: Secondary | ICD-10-CM

## 2011-05-16 DIAGNOSIS — E785 Hyperlipidemia, unspecified: Secondary | ICD-10-CM

## 2011-05-16 DIAGNOSIS — R5381 Other malaise: Secondary | ICD-10-CM

## 2011-05-16 DIAGNOSIS — R748 Abnormal levels of other serum enzymes: Secondary | ICD-10-CM

## 2011-05-16 LAB — CBC WITH DIFFERENTIAL/PLATELET
Basophils Absolute: 0 10*3/uL (ref 0.0–0.1)
Eosinophils Absolute: 0.1 10*3/uL (ref 0.0–0.7)
HCT: 40.4 % (ref 36.0–46.0)
Lymphs Abs: 2.5 10*3/uL (ref 0.7–4.0)
MCHC: 33.8 g/dL (ref 30.0–36.0)
MCV: 90.8 fl (ref 78.0–100.0)
Monocytes Absolute: 0.3 10*3/uL (ref 0.1–1.0)
Neutrophils Relative %: 62.4 % (ref 43.0–77.0)
Platelets: 232 10*3/uL (ref 150.0–400.0)
RDW: 13 % (ref 11.5–14.6)
WBC: 7.8 10*3/uL (ref 4.5–10.5)

## 2011-05-16 LAB — HEPATIC FUNCTION PANEL
ALT: 107 U/L — ABNORMAL HIGH (ref 0–35)
Bilirubin, Direct: 0 mg/dL (ref 0.0–0.3)
Total Bilirubin: 0.1 mg/dL — ABNORMAL LOW (ref 0.3–1.2)

## 2011-05-16 LAB — LDL CHOLESTEROL, DIRECT: Direct LDL: 161 mg/dL

## 2011-05-16 LAB — LIPID PANEL
Cholesterol: 226 mg/dL — ABNORMAL HIGH (ref 0–200)
HDL: 46 mg/dL (ref >39.00)
Total CHOL/HDL Ratio: 5
Triglycerides: 74 mg/dL (ref 0.0–149.0)

## 2011-05-16 LAB — BASIC METABOLIC PANEL
BUN: 23 mg/dL (ref 6–23)
CO2: 26 mEq/L (ref 19–32)
Chloride: 107 mEq/L (ref 96–112)
Creatinine, Ser: 0.7 mg/dL (ref 0.4–1.2)
Glucose, Bld: 86 mg/dL (ref 70–99)
Potassium: 4.3 mEq/L (ref 3.5–5.1)

## 2011-05-16 NOTE — Telephone Encounter (Signed)
Pt saw Dr Patsy Lager this morning and needs letter to cancel trip scheduled tomorrow. Pt said does not feel like traveling. Pt can be reached at [phone].

## 2011-05-16 NOTE — Progress Notes (Addendum)
Patient Name: Jennifer Mendez Date of Birth: 29-Jul-1965 Medical Record Number: [id number] Gender: female Date of Encounter: 05/16/2011  History of Present Illness:  Jennifer Mendez is a 46 y.o. very pleasant female patient who presents with the following:  Patient presents with a primary complaint of having difficulty to say what she wanted to say multiple times over the last 2 days. She knew exactly what she wanted to say, but fumbled getting her words out but could not say some even simple commands or phrases. This lasted for anywhere from 30 seconds to about a minute, and happened on one occasion for 5 times during a 30 minute presentation for work. At that time, the patient did not feel any significant stress, anxiety attacks, panic attack, tachycardia, nauseousness, sweaty palms, or other such similar symptoms.  Has been under a lot of stress at work, has been having probs. Since yesterday, has had some problems speaking. Felt short of air, and had to stop. Not sleeping well, and has not been eating well.   Knew what she wanted to say, but stumble. Could not put words a little bit short of breath, and right arm -- started this morning.  Had trouble coming up with words, 4-5 times during a 30 minute. Not tacycardic, not seaty, no nausea.  Feeling tired, feels a lot of stress.   Past few months, maybe a little weaker over the last couple of months.  No balance problems No visual changes  Patient Active Problem List  Diagnoses  . HYPERLIPIDEMIA  . FATIGUE  . MUSCLE SPASM, TRAPEZIUS MUSCLE, RIGHT  . Shoulder pain, right  . Right knee pain   Past Medical History  Diagnosis Date  . Periapical abscess without sinus   . Blepharospasm   . Unspecified constipation   . Other malaise and fatigue   . Other and unspecified hyperlipidemia   . IUD   . Spasm of muscle   . Leukorrhea, not specified as infective    Past Surgical History  Procedure Date  . Lasix 1998    Bilateral  .  Pelvic x-ray 07/2004    IUD in place   History  Substance Use Topics  . Smoking status: Never Smoker   . Smokeless tobacco: Not on file  . Alcohol Use: Yes     Occasional glass of wine   Family History  Problem Relation Age of Onset  . Tuberculosis Father     ?  Marland Kitchen Tuberculosis Mother    No Known Allergies  Medication list has been reviewed and updated.  Review of Systems:  GEN: No acute illnesses, no fevers, chills. fatigue GI: No n/v/d, eating normally Pulm: No SOB Neuro: As above. No balance disturbance. No focal new weakness. No numbness. No blurred vision or ocular change. No bowel or bladder incontinence. The patient has been thinking clearly the entire time Interactive and getting along well at home.  Otherwise, ROS is as per the HPI.   Physical Examination: Filed Vitals:   05/16/11 0914  BP: 102/70  Pulse: 70  Temp: 98.4 F (36.9 C)  TempSrc: Oral  Height: 5' (1.524 m)  Weight: 132 lb (59.875 kg)  SpO2: 97%    Body mass index is 25.78 kg/(m^2).   GEN: WDWN, NAD, Non-toxic, A & O x 3 HEENT: Atraumatic, Normocephalic. Neck supple. No masses, No LAD. Ears and Nose: No external deformity. CV: RRR, No M/G/R. No JVD. No thrill. No extra heart sounds. PULM: CTA B, no wheezes, crackles, rhonchi. No retractions. No  resp. distress. No accessory muscle use. ABD: S, NT, ND, +BS. No rebound tenderness. No HSM.  EXTR: No c/c/e  Neuro: CN 2-12 grossly intact. PERRLA. EOMI. Sensation intact throughout. Str 5/5 all extremities. DTR 2+. No clonus. A and o x 4. Romberg neg. Finger nose neg. Heel -shin neg.   PSYCH: Normally interactive. Conversant. Not depressed or anxious appearing.  Calm demeanor.     Assessment and Plan:  1. Expressive aphasia  MR Brain Wo Contrast, MR Brain Wo Contrast  2. Other and unspecified hyperlipidemia  Lipid panel  3. Other malaise and fatigue  Basic metabolic panel, CBC with Differential, Hepatic function panel, TSH   Start ASA 81  mg  New onset expressive aphasia x2 days. Multiple times. At this point, the patient's neurological exam is normal, but she describes clear expressive aphasia with no clear cause. Differential diagnosis includes stroke, multiple sclerosis, other demyelinating syndrome, or less likely neoplastic process. Obtain an MRI of the brain without contrast to evaluate for above  Orders Today: Orders Placed This Encounter  Procedures  . MR Brain Wo Contrast    Standing Status: Future     Number of Occurrences:      Standing Expiration Date: 07/15/2012    Order Specific Question:  Reason for exam:    Answer:  expressive aphasia( rule out CVA, MS)     Comments:  Call report to dr Karleen Hampshire Kashana Breach at [phone]    Order Specific Question:  Is the patient pregnant?    Answer:  No    Order Specific Question:  Preferred imaging location?    Answer:  [facility]    Order Specific Question:  Does the patient have a pacemaker, internal devices, implants, aneury    Answer:  No  . Basic metabolic panel  . CBC with Differential  . Hepatic function panel  . Lipid panel  . TSH    Medications Today: No orders of the defined types were placed in this encounter.     Addendum:  Mr Sherrin Daisy Contrast  05/16/2011  *RADIOLOGY REPORT*  Clinical Data: Episodic aphasia.  Possible CVA.  Possible multiple sclerosis.  MRI HEAD WITHOUT CONTRAST  Technique:  Multiplanar, multiecho pulse sequences of the brain and surrounding structures were obtained according to standard protocol without intravenous contrast.  Comparison: None.  Findings: There is no evidence for acute infarction, intracranial hemorrhage, mass lesion, hydrocephalus, or extra-axial fluid. There is no atrophy or white matter disease.  Sagittal FLAIR images demonstrate no callosal, brainstem, or upper cervical white matter lesions.  Coronal T2-weighted images demonstrate no displacement of the midline with normal temporal lobes.  The carotid and  basilar arteries are widely patent.  Flow voids are maintained in the major vessels at the base the brain.  Pituitary and cerebellar tonsils are unremarkable.  There is no orbital or sinus abnormality.  No significant mastoid fluid is present.  IMPRESSION: Unremarkable noncontrast cranial MRI.  No evidence for acute stroke or white matter disease.  I called the report to the ordering physician.  Original Report Authenticated By: Elsie Stain, M.D.   Comprehensive Metabolic Panel:    Component Value Date/Time   NA 140 05/16/2011 1010   K 4.3 05/16/2011 1010   CL 107 05/16/2011 1010   CO2 26 05/16/2011 1010   BUN 23 05/16/2011 1010   CREATININE 0.7 05/16/2011 1010   GLUCOSE 86 05/16/2011 1010   CALCIUM 9.0 05/16/2011 1010   AST 51* 05/16/2011 1010   ALT 107*  05/16/2011 1010   ALKPHOS 61 05/16/2011 1010   BILITOT 0.1* 05/16/2011 1010   PROT 7.4 05/16/2011 1010   ALBUMIN 4.1 05/16/2011 1010   CBC:    Component Value Date/Time   WBC 7.8 05/16/2011 1010   HGB 13.6 05/16/2011 1010   HCT 40.4 05/16/2011 1010   PLT 232.0 05/16/2011 1010   MCV 90.8 05/16/2011 1010   NEUTROABS 4.9 05/16/2011 1010   LYMPHSABS 2.5 05/16/2011 1010   MONOABS 0.3 05/16/2011 1010   EOSABS 0.1 05/16/2011 1010   BASOSABS 0.0 05/16/2011 1010   Check hepatitis, U/S liver.   Concern may be having acute stress reaction / anxiety causing probs -- start benzos, SSRI and recheck next week.

## 2011-05-16 NOTE — Patient Instructions (Signed)
REFERRAL: GO THE THE FRONT ROOM AT THE ENTRANCE OF OUR CLINIC, NEAR CHECK IN. ASK FOR Jennifer Mendez. SHE WILL HELP YOU SET UP YOUR REFERRAL. DATE: TIME:  

## 2011-05-17 ENCOUNTER — Ambulatory Visit
Admission: RE | Admit: 2011-05-17 | Discharge: 2011-05-17 | Disposition: A | Discharge status: Home / Self Care | Payer: BC Managed Care – PPO | Source: Ambulatory Visit | Attending: Family Medicine | Admitting: Family Medicine

## 2011-05-17 ENCOUNTER — Other Ambulatory Visit (INDEPENDENT_AMBULATORY_CARE_PROVIDER_SITE_OTHER): Payer: BC Managed Care – PPO

## 2011-05-17 ENCOUNTER — Telehealth: Payer: Self-pay | Admitting: *Deleted

## 2011-05-17 DIAGNOSIS — E785 Hyperlipidemia, unspecified: Secondary | ICD-10-CM

## 2011-05-17 DIAGNOSIS — R748 Abnormal levels of other serum enzymes: Secondary | ICD-10-CM

## 2011-05-17 MED ORDER — CLONAZEPAM 0.5 MG PO TABS: ORAL_TABLET | ORAL | Status: DC

## 2011-05-17 MED ORDER — FLUOXETINE HCL 20 MG PO TABS: 20.0000 mg | ORAL_TABLET | Freq: Every day | ORAL | Status: DC

## 2011-05-17 NOTE — Progress Notes (Signed)
Addended by: Hannah Beat on: 05/17/2011 08:39 AM   Modules accepted: Orders

## 2011-05-17 NOTE — Telephone Encounter (Signed)
Called ultrasound report from [facility]- abd ultrasound negative.  Pt was sent home.

## 2011-05-18 LAB — HEPATITIS B CORE ANTIBODY, IGM: Hep B C IgM: NEGATIVE

## 2011-05-18 LAB — HEPATITIS B SURFACE ANTIGEN: Hepatitis B Surface Ag: NEGATIVE

## 2011-05-18 LAB — HEPATITIS C ANTIBODY: HCV Ab: NEGATIVE

## 2011-05-19 ENCOUNTER — Encounter: Payer: Self-pay | Admitting: Family Medicine

## 2011-05-19 NOTE — Telephone Encounter (Signed)
done

## 2011-05-22 ENCOUNTER — Encounter: Payer: Self-pay | Admitting: Family Medicine

## 2011-05-22 ENCOUNTER — Ambulatory Visit (INDEPENDENT_AMBULATORY_CARE_PROVIDER_SITE_OTHER): Payer: BC Managed Care – PPO | Admitting: Family Medicine

## 2011-05-22 VITALS — BP 100/60 | HR 76 | Temp 98.1°F | Ht 60.0 in | Wt 134.4 lb

## 2011-05-22 DIAGNOSIS — F419 Anxiety disorder, unspecified: Secondary | ICD-10-CM | POA: Insufficient documentation

## 2011-05-22 DIAGNOSIS — F411 Generalized anxiety disorder: Secondary | ICD-10-CM

## 2011-05-22 NOTE — Patient Instructions (Signed)
F/u 4-5 weeks with Dr. Bedsole 

## 2011-05-22 NOTE — Progress Notes (Signed)
  Patient Name: Jennifer Mendez Date of Birth: 1965-07-06 Age: 46 y.o. Medical Record Number: [id number] Gender: female Date of Encounter: 05/22/2011  History of Present Illness:  Jennifer Mendez is a 46 y.o. very pleasant female patient who presents with the following:  1 week follow-up:  Has not had that continued speech problem.   F/u expressive aphasia, problem has not recurred. Had a normal MRI of the brain, LFT's were mildly elevated. Hepatitis B and C were negative. Hep B immune.   A little tired now taking Klonopin 0.25 mg at night  Past Medical History, Surgical History, Social History, Family History, Problem List, Medications, and Allergies have been reviewed and updated if relevant.  Review of Systems: As above, sleepy. No recurrence of symptoms. Some stress.  Physical Examination: Filed Vitals:   05/22/11 1404  BP: 100/60  Pulse: 76  Temp: 98.1 F (36.7 C)  TempSrc: Oral  Height: 5' (1.524 m)  Weight: 134 lb 6.4 oz (60.963 kg)  SpO2: 98%    Body mass index is 26.25 kg/(m^2).   GEN: WDWN, NAD, Non-toxic, A & O x 3 HEENT: Atraumatic, Normocephalic. Neck supple. No masses, No LAD. Ears and Nose: No external deformity. CV: RRR, No M/G/R. No JVD. No thrill. No extra heart sounds. PULM: CTA B, no wheezes, crackles, rhonchi. No retractions. No resp. distress. No accessory muscle use. EXTR: No c/c/e NEURO Normal gait.  PSYCH: Normally interactive. Conversant. Not depressed or anxious appearing.  Calm demeanor.    Assessment and Plan: 1. Anxiety    Elevated LFT, u/s neg, will need recheck HFP in 4-6 week  Recheck Dr. Ermalene Searing on anxiety,  Aphasia symptoms and recheck LFT's in 4-6 weeks. Ideally, can d/c Klonopin if feeling better on Prozac alone.

## 2011-09-04 ENCOUNTER — Encounter: Payer: Self-pay | Admitting: Family Medicine

## 2011-09-04 ENCOUNTER — Ambulatory Visit (INDEPENDENT_AMBULATORY_CARE_PROVIDER_SITE_OTHER): Payer: BC Managed Care – PPO | Admitting: Family Medicine

## 2011-09-04 VITALS — BP 112/69 | HR 86 | Ht 61.0 in | Wt 132.0 lb

## 2011-09-04 DIAGNOSIS — N898 Other specified noninflammatory disorders of vagina: Secondary | ICD-10-CM

## 2011-09-04 MED ORDER — METRONIDAZOLE 500 MG PO TABS: 500.0000 mg | ORAL_TABLET | Freq: Two times a day (BID) | ORAL | Status: DC

## 2011-09-04 NOTE — Patient Instructions (Signed)
Bacterial Vaginosis Bacterial vaginosis (BV) is a vaginal infection where the normal balance of bacteria in the vagina is disrupted. The normal balance is then replaced by an overgrowth of certain bacteria. There are several different kinds of bacteria that can cause BV. BV is the most common vaginal infection in women of childbearing age. CAUSES   The cause of BV is not fully understood. BV develops when there is an increase or imbalance of harmful bacteria.   Some activities or behaviors can upset the normal balance of bacteria in the vagina and put women at increased risk including:   Having a new sex partner or multiple sex partners.   Douching.   Using an intrauterine device (IUD) for contraception.   It is not clear what role sexual activity plays in the development of BV. However, women that have never had sexual intercourse are rarely infected with BV.  Women do not get BV from toilet seats, bedding, swimming pools or from touching objects around them.  SYMPTOMS   Grey vaginal discharge.   A fish-like odor with discharge, especially after sexual intercourse.   Itching or burning of the vagina and vulva.   Burning or pain with urination.   Some women have no signs or symptoms at all.  DIAGNOSIS  Your caregiver must examine the vagina for signs of BV. Your caregiver will perform lab tests and look at the sample of vaginal fluid through a microscope. They will look for bacteria and abnormal cells (clue cells), a pH test higher than 4.5, and a positive amine test all associated with BV.  RISKS AND COMPLICATIONS   Pelvic inflammatory disease (PID).   Infections following gynecology surgery.   Developing HIV.   Developing herpes virus.  TREATMENT  Sometimes BV will clear up without treatment. However, all women with symptoms of BV should be treated to avoid complications, especially if gynecology surgery is planned. Female partners generally do not need to be treated. However,  BV may spread between female sex partners so treatment is helpful in preventing a recurrence of BV.   BV may be treated with antibiotics. The antibiotics come in either pill or vaginal cream forms. Either can be used with nonpregnant or pregnant women, but the recommended dosages differ. These antibiotics are not harmful to the baby.   BV can recur after treatment. If this happens, a second round of antibiotics will often be prescribed.   Treatment is important for pregnant women. If not treated, BV can cause a premature delivery, especially for a pregnant woman who had a premature birth in the past. All pregnant women who have symptoms of BV should be checked and treated.   For chronic reoccurrence of BV, treatment with a type of prescribed gel vaginally twice a week is helpful.  HOME CARE INSTRUCTIONS   Finish all medication as directed by your caregiver.   Do not have sex until treatment is completed.   Tell your sexual partner that you have a vaginal infection. They should see their caregiver and be treated if they have problems, such as a mild rash or itching.   Practice safe sex. Use condoms. Only have 1 sex partner.  PREVENTION  Basic prevention steps can help reduce the risk of upsetting the natural balance of bacteria in the vagina and developing BV:  Do not have sexual intercourse (be abstinent).   Do not douche.   Use all of the medicine prescribed for treatment of BV, even if the signs and symptoms go away.     Tell your sex partner if you have BV. That way, they can be treated, if needed, to prevent reoccurrence.  SEEK MEDICAL CARE IF:   Your symptoms are not improving after 3 days of treatment.   You have increased discharge, pain, or fever.  MAKE SURE YOU:   Understand these instructions.   Will watch your condition.   Will get help right away if you are not doing well or get worse.  FOR MORE INFORMATION  Division of STD Prevention (DSTDP), Centers for Disease  Control and Prevention: www.cdc.gov/std American Social Health Association (ASHA): www.ashastd.org  Document Released: 01/21/2005 Document Revised: 01/10/2011 Document Reviewed: 07/14/2008 Exit[employer] Patient Information 2012 [employer]. 

## 2011-09-04 NOTE — Progress Notes (Signed)
  Subjective:    Patient ID: Jennifer Mendez, female    DOB: 1965-04-29, 46 y.o.   MRN: [id number]  Gynecologic Exam The patient's primary symptoms include a genital odor and a vaginal discharge. The patient's pertinent negatives include no genital itching, genital rash, missed menses or pelvic pain. This is a recurrent problem. The current episode started 1 to 4 weeks ago. The problem occurs intermittently. The problem has been gradually improving. The patient is experiencing no pain. She is not pregnant. Pertinent negatives include no back pain, chills, fever, joint swelling, nausea or vomiting. The vaginal discharge was clear, watery and malodorous. There has been no bleeding. She has tried nothing for the symptoms. She is sexually active. No, her partner does not have an STD. She uses an IUD for contraception. Her menstrual history has been irregular. There is no history of herpes simplex or PID.      Review of Systems  Constitutional: Negative for fever and chills.  Gastrointestinal: Negative for nausea and vomiting.  Genitourinary: Positive for vaginal discharge. Negative for pelvic pain and missed menses.  Musculoskeletal: Negative for back pain.       Objective:   Physical Exam  Vitals reviewed. Constitutional: She appears well-developed and well-nourished.  HENT:  Head: Normocephalic and atraumatic.  Neck: Neck supple.  Cardiovascular: Normal rate.   Pulmonary/Chest: Effort normal.  Abdominal: Soft. There is no tenderness.  Genitourinary: Uterus normal. Uterus is not tender. Cervix exhibits no motion tenderness and no friability. Right adnexum displays no mass and no tenderness. Left adnexum displays no mass and no tenderness. Vaginal discharge (yellow/pink, watery) found.  Musculoskeletal: Normal range of motion.          Assessment & Plan:   1. Vaginal Discharge  Wet prep, genital, GC/chlamydia probe amp, genital   Flagyl for presumptive BV.  ? Related to IUD.  Notes  no douching or sit down baths.  She is not interested in removal of IUD at this time.

## 2011-09-05 LAB — WET PREP, GENITAL: Trich, Wet Prep: NONE SEEN

## 2011-09-06 ENCOUNTER — Ambulatory Visit (INDEPENDENT_AMBULATORY_CARE_PROVIDER_SITE_OTHER): Payer: BC Managed Care – PPO | Admitting: Family Medicine

## 2011-09-06 ENCOUNTER — Encounter: Payer: Self-pay | Admitting: Family Medicine

## 2011-09-06 VITALS — BP 100/64 | HR 85 | Temp 98.6°F | Resp 20 | Ht 61.0 in | Wt 134.0 lb

## 2011-09-06 DIAGNOSIS — L299 Pruritus, unspecified: Secondary | ICD-10-CM

## 2011-09-06 LAB — CBC WITH DIFFERENTIAL/PLATELET
Eosinophils Absolute: 0.2 10*3/uL (ref 0.0–0.7)
Hemoglobin: 13.7 g/dL (ref 12.0–15.0)
Lymphs Abs: 2.8 10*3/uL (ref 0.7–4.0)
Monocytes Relative: 7 % (ref 3–12)
Neutro Abs: 5.8 10*3/uL (ref 1.7–7.7)
Neutrophils Relative %: 61 % (ref 43–77)
Platelets: 272 10*3/uL (ref 150–400)
RBC: 4.59 MIL/uL (ref 3.87–5.11)
WBC: 9.4 10*3/uL (ref 4.0–10.5)

## 2011-09-06 NOTE — Progress Notes (Signed)
  Subjective:    Patient ID: Jennifer Mendez, female    DOB: [birth year], 46 y.o.   MRN: [id number]  HPI  56 yer old female with history of fatgue, anxiety and high cholesterol presents with 2 months of rash itchy on arms legs, neck back.  No pustule no vesicles. Starts as red area, then she scratches and gets scab. Areas are continuing to pop up.   It improved with an ijection of likely steroids given in [place], then recurred.  Feeling well otherwise, no flu like symptoms. No new changes, no change in lotion, detergent. No new foods. No sick contacts.  Applying steroid tape and clobetasol cream now.Marland Kitchen Helped none  No family history of autoimmune disease.  Review of Systems  Constitutional: Negative for fever and fatigue.  HENT: Negative for ear pain.   Eyes: Negative for pain.  Respiratory: Negative for chest tightness and shortness of breath.   Cardiovascular: Negative for chest pain, palpitations and leg swelling.  Gastrointestinal: Negative for abdominal pain.  Genitourinary: Negative for dysuria.       Objective:   Physical Exam  Constitutional: Vital signs are normal. She appears well-developed and well-nourished. She is cooperative.  Non-toxic appearance. She does not appear ill. No distress.  HENT:  Head: Normocephalic.  Right Ear: Hearing, tympanic membrane, external ear and ear canal normal. Tympanic membrane is not erythematous, not retracted and not bulging.  Left Ear: Hearing, tympanic membrane, external ear and ear canal normal. Tympanic membrane is not erythematous, not retracted and not bulging.  Nose: No mucosal edema or rhinorrhea. Right sinus exhibits no maxillary sinus tenderness and no frontal sinus tenderness. Left sinus exhibits no maxillary sinus tenderness and no frontal sinus tenderness.  Mouth/Throat: Uvula is midline, oropharynx is clear and moist and mucous membranes are normal.  Eyes: Conjunctivae, EOM and lids are normal. Pupils are equal, round,  and reactive to light. No foreign bodies found.  Neck: Trachea normal and normal range of motion. Neck supple. Carotid bruit is not present. No mass and no thyromegaly present.  Cardiovascular: Normal rate, regular rhythm, S1 normal, S2 normal, normal heart sounds, intact distal pulses and normal pulses.  Exam reveals no gallop and no friction rub.   No murmur heard. Pulmonary/Chest: Effort normal and breath sounds normal. Not tachypneic. No respiratory distress. She has no decreased breath sounds. She has no wheezes. She has no rhonchi. She has no rales.  Abdominal: Soft. Normal appearance and bowel sounds are normal. There is no tenderness.  Neurological: She is alert.  Skin: Skin is warm, dry and intact. No rash noted.       Two nodules right leg, over arms neck legs where she can reach.. erythematous nodules and scabs.  Psychiatric: Her speech is normal and behavior is normal. Judgment and thought content normal. Her mood appears not anxious. Cognition and memory are normal. She does not exhibit a depressed mood.          Assessment & Plan:

## 2011-09-06 NOTE — Addendum Note (Signed)
Addended by: Alvina Chou on: 09/06/2011 05:11 PM   Modules accepted: Orders

## 2011-09-06 NOTE — Patient Instructions (Addendum)
We will call you with lab results and further recommendations. Start allegra at bedtime for antihistamine. In next few months... Make an appt for complete physical exam.

## 2011-09-06 NOTE — Assessment & Plan Note (Addendum)
Reviewed Derm note in detail.. Felt rash likely dermatitis with superimposed purigo nodules. I agree with diagnosis given area with most rash are where she can reach.  it sound like it improved with an injection of likely steroids given in [place], then recurred. Eval with labs for secondary cause of itching. Will have her start allegra  Or possibly atarax if this does not work for contact derm. May consider steroid course versus return to [facility] for biopsy.

## 2011-09-07 LAB — TSH: TSH: 3.408 u[IU]/mL (ref 0.350–4.500)

## 2011-09-07 LAB — SEDIMENTATION RATE: Sed Rate: 11 mm/hr (ref 0–22)

## 2011-09-09 LAB — ANA: Anti Nuclear Antibody(ANA): NEGATIVE

## 2011-09-11 MED ORDER — PREDNISONE 20 MG PO TABS: ORAL_TABLET | ORAL | Status: DC

## 2011-09-11 NOTE — Addendum Note (Signed)
Addended by: Kerby Nora E on: 09/11/2011 10:16 PM   Modules accepted: Orders

## 2011-10-11 ENCOUNTER — Encounter: Payer: Self-pay | Admitting: Family Medicine

## 2011-10-11 ENCOUNTER — Ambulatory Visit (INDEPENDENT_AMBULATORY_CARE_PROVIDER_SITE_OTHER): Payer: BC Managed Care – PPO | Admitting: Family Medicine

## 2011-10-11 VITALS — BP 100/62 | HR 80 | Temp 97.7°F | Resp 16 | Wt 135.5 lb

## 2011-10-11 DIAGNOSIS — L299 Pruritus, unspecified: Secondary | ICD-10-CM

## 2011-10-11 DIAGNOSIS — R21 Rash and other nonspecific skin eruption: Secondary | ICD-10-CM

## 2011-10-11 NOTE — Patient Instructions (Addendum)
Stop at front to get referral to second opinion dermatologist and for possible biopsy.

## 2011-10-11 NOTE — Progress Notes (Signed)
  Subjective:    Patient ID: Jennifer Mendez, female    DOB: [birth year], 46 y.o.   MRN: [id number]  HPI Last saw her for rash: Pruritus - Kerby Nora, MD 09/06/2011 3:17 PM Addendum  Reviewed Derm note in detail.. Felt rash likely dermatitis with superimposed purigo nodules. I agree with diagnosis given area with most rash are where she can reach.  It sound like it improved with an injection of likely steroids given in [place], then recurred.  Eval with labs for secondary cause of itching.  Will have her start allegra Or possibly atarax if this does not work for contact derm.  May consider steroid course versus return to [facility] for biopsy.  She reports that the rash almost resolved on prednsione then recurred. She did go back to [place] in interim. No changes. Nodules on legs,torso , arms and neck puritic.      Review of Systems  Constitutional: Negative for fever and fatigue.  HENT: Negative for ear pain.   Eyes: Negative for pain.  Respiratory: Negative for chest tightness and shortness of breath.   Cardiovascular: Negative for chest pain, palpitations and leg swelling.  Gastrointestinal: Negative for abdominal pain.  Genitourinary: Negative for dysuria.       Objective:   Physical Exam  Constitutional: Vital signs are normal. She appears well-developed and well-nourished. She is cooperative.  Non-toxic appearance. She does not appear ill. No distress.  HENT:  Head: Normocephalic.  Right Ear: Hearing, tympanic membrane, external ear and ear canal normal. Tympanic membrane is not erythematous, not retracted and not bulging.  Left Ear: Hearing, tympanic membrane, external ear and ear canal normal. Tympanic membrane is not erythematous, not retracted and not bulging.  Nose: No mucosal edema or rhinorrhea. Right sinus exhibits no maxillary sinus tenderness and no frontal sinus tenderness. Left sinus exhibits no maxillary sinus tenderness and no frontal sinus tenderness.  Mouth/Throat:  Uvula is midline, oropharynx is clear and moist and mucous membranes are normal.  Eyes: Conjunctivae, EOM and lids are normal. Pupils are equal, round, and reactive to light. No foreign bodies found.  Neck: Trachea normal and normal range of motion. Neck supple. Carotid bruit is not present. No mass and no thyromegaly present.  Cardiovascular: Normal rate, regular rhythm, S1 normal, S2 normal, normal heart sounds, intact distal pulses and normal pulses.  Exam reveals no gallop and no friction rub.   No murmur heard. Pulmonary/Chest: Effort normal and breath sounds normal. Not tachypneic. No respiratory distress. She has no decreased breath sounds. She has no wheezes. She has no rhonchi. She has no rales.  Abdominal: Soft. Normal appearance and bowel sounds are normal. There is no tenderness.  Neurological: She is alert.  Skin: Skin is warm, dry and intact. No rash noted.       Two nodules right leg, over arms neck legs where she can reach.. erythematous nodules and scabs.  Psychiatric: Her speech is normal and behavior is normal. Judgment and thought content normal. Her mood appears not anxious. Cognition and memory are normal. She does not exhibit a depressed mood.          Assessment & Plan:

## 2011-10-11 NOTE — Assessment & Plan Note (Signed)
UNclear cause of rash.. Steroid helped but rash returned. Will refer back to derm for assistance and possible biopsy.

## 2011-10-15 ENCOUNTER — Ambulatory Visit: Payer: BC Managed Care – PPO | Admitting: Family Medicine

## 2012-01-07 ENCOUNTER — Telehealth: Payer: Self-pay

## 2012-01-07 DIAGNOSIS — H539 Unspecified visual disturbance: Secondary | ICD-10-CM

## 2012-01-07 NOTE — Telephone Encounter (Signed)
Pt left v/m requesting referral to eye doctor; pt having problem with vision; pt has hx of lasik surgery.

## 2012-02-25 ENCOUNTER — Telehealth: Payer: Self-pay | Admitting: Family Medicine

## 2012-02-25 DIAGNOSIS — R21 Rash and other nonspecific skin eruption: Secondary | ICD-10-CM

## 2012-02-25 NOTE — Telephone Encounter (Signed)
Patient wants to do dermatology referral first

## 2012-02-25 NOTE — Telephone Encounter (Signed)
We can refer to tertiary cetner derm such as [facility] and or she can see allergist. Let me know if she wishes to proceed with either or  both

## 2012-02-25 NOTE — Telephone Encounter (Signed)
Patient has seen 2 different Dermatologists and noone can figure out what her rash is from? Has had rash for over an year. Should she have allergy testing? She wants to know what she should do now go back to Derm? Wants [facility] now as her job has moved. Please call her at [phone].

## 2012-02-26 ENCOUNTER — Encounter: Payer: Self-pay | Admitting: Obstetrics & Gynecology

## 2012-02-26 ENCOUNTER — Ambulatory Visit (INDEPENDENT_AMBULATORY_CARE_PROVIDER_SITE_OTHER): Payer: BC Managed Care – PPO | Admitting: Obstetrics & Gynecology

## 2012-02-26 VITALS — BP 115/77 | HR 78 | Ht 61.0 in | Wt 133.0 lb

## 2012-02-26 DIAGNOSIS — A499 Bacterial infection, unspecified: Secondary | ICD-10-CM

## 2012-02-26 DIAGNOSIS — B373 Candidiasis of vulva and vagina: Secondary | ICD-10-CM

## 2012-02-26 DIAGNOSIS — N760 Acute vaginitis: Secondary | ICD-10-CM

## 2012-02-26 NOTE — Patient Instructions (Signed)
Use 1% hydrocortisone cream externally three-four times a day for the irritation We will call you tomorrow with the results and treatment  Return to clinic for any scheduled appointments or for any gynecologic concerns as needed.

## 2012-02-27 LAB — WET PREP, GENITAL

## 2012-02-27 NOTE — Progress Notes (Signed)
History:  47 y.o. G3P1 here today for vulvar irritation and abnormal discharge  X 3 days.  Has recently starting ling baths with Epsom salts and wonders if this is what caused it. No other symptoms.  The following portions of the patient's history were reviewed and updated as appropriate: allergies, current medications, past family history, past medical history, past social history, past surgical history and problem list.  Review of Systems:  Pertinent items are noted in HPI.  Objective:  Physical Exam BP 115/77  Pulse 78  Ht 5\' 1"  (1.549 m)  Wt 133 lb (60.328 kg)  BMI 25.13 kg/m2 Gen: NAD Abd: Soft, nontender and nondistended Pelvic: Normal appearing external genitalia; normal appearing vaginal mucosa and cervix.  Thin white discharge noted.   Wet prep sample obtained. Small uterus, no other palpable masses, no uterine or adnexal tenderness  Assessment & Plan:  Will follow up results of wet prep and manage accordingly.    Lab Addendum 02/28/12 Results for orders placed in visit on 02/26/12 (from the past 72 hour(s))  WET PREP, GENITAL     Status: Abnormal   Collection Time   02/26/12 11:27 AM      Component Value Range Comment   Yeast Wet Prep HPF POC MOD (*) NONE SEEN    Trich, Wet Prep NONE SEEN  NONE SEEN    Clue Cells Wet Prep HPF POC FEW (*) NONE SEEN    WBC, Wet Prep HPF POC TNTC (*) NONE SEEN    Diflucan and Tinidazole e-prescribed.  Patient notified of diagnosis and informed to pick up prescriptions. Advised to discontinue the Epsom salt baths for now, can just have baths with water and mild soap.

## 2012-02-28 MED ORDER — FLUCONAZOLE 150 MG PO TABS: 150.0000 mg | ORAL_TABLET | Freq: Once | ORAL | Status: DC

## 2012-02-28 MED ORDER — TINIDAZOLE 500 MG PO TABS: 2.0000 g | ORAL_TABLET | Freq: Every day | ORAL | Status: DC

## 2012-09-11 ENCOUNTER — Ambulatory Visit (INDEPENDENT_AMBULATORY_CARE_PROVIDER_SITE_OTHER): Payer: BC Managed Care – PPO | Admitting: Family Medicine

## 2012-09-11 ENCOUNTER — Encounter: Payer: Self-pay | Admitting: Family Medicine

## 2012-09-11 ENCOUNTER — Ambulatory Visit: Payer: BC Managed Care – PPO | Admitting: Family Medicine

## 2012-09-11 VITALS — BP 100/62 | HR 73 | Temp 98.0°F | Ht 61.0 in | Wt 138.0 lb

## 2012-09-11 DIAGNOSIS — R350 Frequency of micturition: Secondary | ICD-10-CM

## 2012-09-11 DIAGNOSIS — J029 Acute pharyngitis, unspecified: Secondary | ICD-10-CM

## 2012-09-11 DIAGNOSIS — R300 Dysuria: Secondary | ICD-10-CM

## 2012-09-11 LAB — POCT RAPID STREP A (OFFICE): Rapid Strep A Screen: NEGATIVE

## 2012-09-11 LAB — POCT URINALYSIS DIPSTICK
Bilirubin, UA: NEGATIVE
Ketones, UA: NEGATIVE
Spec Grav, UA: 1.015
pH, UA: 6

## 2012-09-11 NOTE — Patient Instructions (Addendum)
Ibuprofen for sore throat pain. Symptoms care for viral infection. Expecte 57 days mpre of illness. No infection in urine. Likely bladder spasm. INcrease fluids, decrease bladder irritants like alchol, caffeine, chocolate, citris , tomato, spicy foods.  Call if not improving as expected.

## 2012-09-11 NOTE — Progress Notes (Signed)
Subjective:    Patient ID: Jennifer Mendez, female    DOB: 1965-12-13, 47 y.o.   MRN: [id number]  Urinary Frequency  This is a new problem. The current episode started more than 1 month ago (4-5 months). The problem occurs intermittently. The problem has been unchanged. Quality: no dysuria. There has been no fever. She is not sexually active. There is no history of pyelonephritis. Associated symptoms include frequency. Pertinent negatives include no chills, flank pain, hematuria, hesitancy, nausea, urgency or vomiting. Associated symptoms comments: Urinary pressure. She has tried nothing for the symptoms. There is no history of catheterization, kidney stones, recurrent UTIs, a single kidney, urinary stasis or a urological procedure.  Sore Throat  This is a new problem. The current episode started in the past 7 days (saw something whit in left back of throat). Neither side of throat is experiencing more pain than the other. There has been no fever. The pain is moderate. Associated symptoms include congestion. Pertinent negatives include no coughing, ear discharge, ear pain, headaches, hoarse voice, neck pain, shortness of breath, swollen glands, trouble swallowing or vomiting. Associated symptoms comments: Fatigue, sneeze, runny nose. She has tried NSAIDs for the symptoms. The treatment provided mild relief.      Review of Systems  Constitutional: Negative for chills.  HENT: Positive for congestion. Negative for ear pain, hoarse voice, trouble swallowing, neck pain and ear discharge.   Respiratory: Negative for cough and shortness of breath.   Gastrointestinal: Negative for nausea and vomiting.  Genitourinary: Positive for frequency. Negative for hesitancy, urgency, hematuria and flank pain.  Neurological: Negative for headaches.       Objective:   Physical Exam  Constitutional: Vital signs are normal. She appears well-developed and well-nourished. She is cooperative.  Non-toxic appearance.  She does not appear ill. No distress.  HENT:  Head: Normocephalic.  Right Ear: Hearing, tympanic membrane, external ear and ear canal normal. Tympanic membrane is not erythematous, not retracted and not bulging.  Left Ear: Hearing, tympanic membrane, external ear and ear canal normal. Tympanic membrane is not erythematous, not retracted and not bulging.  Nose: Mucosal edema and rhinorrhea present. Right sinus exhibits no maxillary sinus tenderness and no frontal sinus tenderness. Left sinus exhibits no maxillary sinus tenderness and no frontal sinus tenderness.  Mouth/Throat: Uvula is midline and mucous membranes are normal. Oral lesions present. Posterior oropharyngeal erythema present. No oropharyngeal exudate.  Ulcer in left oropharynx  Eyes: Conjunctivae, EOM and lids are normal. Pupils are equal, round, and reactive to light. No foreign bodies found.  Neck: Trachea normal and normal range of motion. Neck supple. Carotid bruit is not present. No mass and no thyromegaly present.  Cardiovascular: Normal rate, regular rhythm, S1 normal, S2 normal, normal heart sounds, intact distal pulses and normal pulses.  Exam reveals no gallop and no friction rub.   No murmur heard. Pulmonary/Chest: Effort normal and breath sounds normal. Not tachypneic. No respiratory distress. She has no decreased breath sounds. She has no wheezes. She has no rhonchi. She has no rales.  Abdominal: Normal appearance and bowel sounds are normal. She exhibits no mass. There is no hepatosplenomegaly. There is no tenderness. There is no CVA tenderness.  Neurological: She is alert.  Skin: Skin is warm, dry and intact. No rash noted.  Psychiatric: Her speech is normal and behavior is normal. Judgment normal. Her mood appears not anxious. Cognition and memory are normal. She does not exhibit a depressed mood.  Assessment & Plan:

## 2012-09-11 NOTE — Assessment & Plan Note (Signed)
No infection. Likely bladder spasm.. Decrease triggers.  If not improvoing consider medication to treat like toviaz.

## 2012-09-11 NOTE — Assessment & Plan Note (Signed)
Small ulcer in left oropharynx.  Symptomatic care, reviewed expected time course of illness.

## 2012-09-15 ENCOUNTER — Ambulatory Visit: Payer: Self-pay | Admitting: Family Medicine

## 2012-09-17 ENCOUNTER — Encounter: Payer: Self-pay | Admitting: *Deleted

## 2012-09-17 ENCOUNTER — Encounter: Payer: Self-pay | Admitting: Family Medicine

## 2013-06-17 ENCOUNTER — Encounter: Payer: Self-pay | Admitting: Family Medicine

## 2013-06-17 ENCOUNTER — Ambulatory Visit (INDEPENDENT_AMBULATORY_CARE_PROVIDER_SITE_OTHER): Payer: BC Managed Care – PPO | Admitting: Family Medicine

## 2013-06-17 VITALS — BP 122/84 | HR 89 | Temp 98.3°F | Wt 133.2 lb

## 2013-06-17 DIAGNOSIS — J029 Acute pharyngitis, unspecified: Secondary | ICD-10-CM

## 2013-06-17 DIAGNOSIS — R509 Fever, unspecified: Secondary | ICD-10-CM

## 2013-06-17 LAB — POCT RAPID STREP A (OFFICE): RAPID STREP A SCREEN: NEGATIVE

## 2013-06-17 MED ORDER — BENZONATATE 200 MG PO CAPS: 200.0000 mg | ORAL_CAPSULE | Freq: Three times a day (TID) | ORAL | Status: DC | PRN

## 2013-06-17 MED ORDER — LIDOCAINE VISCOUS 2 % MT SOLN: 10.0000 mL | OROMUCOSAL | Status: DC | PRN

## 2013-06-17 NOTE — Patient Instructions (Signed)
Use tessalon for the cough and lidocaine for your sore throat.  Drink plenty of fluids, take tylenol as needed, and gargle with warm salt water for your throat.  This should gradually improve.  Take care.  Let us know if you have other concerns.

## 2013-06-17 NOTE — Assessment & Plan Note (Signed)
Likely viral, nontoxic.  F/u prn.  Supportive care.  Tessalon and lidocaine prn.  She agrees.  RST neg.

## 2013-06-17 NOTE — Progress Notes (Signed)
Pre visit review using our clinic review tool, if applicable. No additional management support is needed unless otherwise documented below in the visit note.  3 days of ST, fatigue, fever, HA.  Rhinorrhea.  ST worse recently.  Using cough drops and nyquil/dayquil.  Still stuffy.  Sick contacts at work noted.  No ear pain.  Diffuse aches.  No vomiting, no diarrhea.    Meds, vitals, and allergies reviewed.   ROS: See HPI.  Otherwise, noncontributory.  GEN: nad, alert and oriented HEENT: mucous membranes moist, tm w/o erythema, nasal exam w/o erythema, clear discharge noted,  OP with cobblestoning, no exudates NECK: supple w/o LA CV: rrr.   PULM: ctab, no inc wob EXT: no edema SKIN: no acute rash Sinuses not ttp  RST neg

## 2013-08-06 ENCOUNTER — Encounter: Payer: Self-pay | Admitting: Family Medicine

## 2013-08-06 ENCOUNTER — Ambulatory Visit (INDEPENDENT_AMBULATORY_CARE_PROVIDER_SITE_OTHER): Payer: BC Managed Care – PPO | Admitting: Family Medicine

## 2013-08-06 VITALS — BP 112/70 | HR 76 | Temp 98.5°F | Wt 131.5 lb

## 2013-08-06 DIAGNOSIS — T148 Other injury of unspecified body region: Secondary | ICD-10-CM

## 2013-08-06 DIAGNOSIS — W57XXXA Bitten or stung by nonvenomous insect and other nonvenomous arthropods, initial encounter: Secondary | ICD-10-CM

## 2013-08-06 MED ORDER — DOXYCYCLINE HYCLATE 100 MG PO TABS: 100.0000 mg | ORAL_TABLET | Freq: Two times a day (BID) | ORAL | Status: DC

## 2013-08-06 NOTE — Assessment & Plan Note (Signed)
One tick bite has a small area of surrounding crescent - cannot r/o poss of lyme/tick fever  Will cover with doxycycline- directions given and disc poss side eff Disc red flags to watch for-- see AVS Will keep bites clean and use cortisone cream for itch Update if not starting to improve in a week or if worsening

## 2013-08-06 NOTE — Patient Instructions (Signed)
Take the antibiotic (doxycycline) Watch for rash , fever, joint pain, or just feeling sick  Let us know if any problems  Keep the bites clean Over the counter cortisone cream should help itch

## 2013-08-06 NOTE — Progress Notes (Signed)
Pre visit review using our clinic review tool, if applicable. No additional management support is needed unless otherwise documented below in the visit note. 

## 2013-08-06 NOTE — Progress Notes (Signed)
Subjective:    Patient ID: Jennifer Mendez, female    DOB: 01-Mar-1965, 48 y.o.   MRN: [id number]  HPI Here for 2 tick bites on L leg Happened ? Sunday- and pulled off Monday Got them in total with tweezers  Now both areas are red and very itchy- getting larger  No rash No bullseye shape to bites No fever or joint pain or other symptoms   She thinks they were deer ticks- very tiny  Patient Active Problem List   Diagnosis Date Noted  . Tick bites 08/06/2013  . Viral pharyngitis 09/11/2012  . Urinary frequency 09/11/2012  . Pruritus 09/06/2011  . Anxiety 05/22/2011  . Right knee pain 10/19/2010  . Shoulder pain, right 06/08/2010  . MUSCLE SPASM, TRAPEZIUS MUSCLE, RIGHT 02/16/2010  . FATIGUE 09/08/2009  . HYPERLIPIDEMIA 09/02/2006   Past Medical History  Diagnosis Date  . Periapical abscess without sinus   . Blepharospasm   . Unspecified constipation   . Other malaise and fatigue   . Other and unspecified hyperlipidemia   . IUD   . Spasm of muscle   . Leukorrhea, not specified as infective   . Dysmenorrhea   . Female pelvic pain    Past Surgical History  Procedure Laterality Date  . Lasix  1998    Bilateral  . Pelvic x-ray  07/2004    IUD in place   History  Substance Use Topics  . Smoking status: Never Smoker   . Smokeless tobacco: Not on file  . Alcohol Use: Yes     Comment: Occasional glass of wine   Family History  Problem Relation Age of Onset  . Tuberculosis Father     ?  Marland Kitchen Tuberculosis Mother    No Known Allergies Current Outpatient Prescriptions on File Prior to Visit  Medication Sig Dispense Refill  . levonorgestrel (MIRENA) 20 MCG/24HR IUD 1 each by Intrauterine route once.        . Multiple Vitamin (MULTIVITAMIN) tablet Take 1 tablet by mouth daily.         No current facility-administered medications on file prior to visit.    Review of Systems     Objective:   Physical Exam  Constitutional: She appears well-developed and  well-nourished.  Eyes: Conjunctivae and EOM are normal. Pupils are equal, round, and reactive to light. Right eye exhibits no discharge. Left eye exhibits no discharge.  Neck: Normal range of motion. Neck supple.  Cardiovascular: Normal rate and regular rhythm.   Pulmonary/Chest: Effort normal and breath sounds normal. No respiratory distress. She has no wheezes. She has no rales.  Musculoskeletal: She exhibits no edema and no tenderness.  No joint changes   Lymphadenopathy:    She has no cervical adenopathy.  Skin: Skin is warm and dry. There is erythema.  Tick bite on L inner thigh- is oval/ 1-2 cm and raised   Tick bite on lower L leg is .5 -1 cm with a small crescent of redness around half of it    No rashes  Psychiatric: She has a normal mood and affect.          Assessment & Plan:   Problem List Items Addressed This Visit     Musculoskeletal and Integument   Tick bites - Primary     One tick bite has a small area of surrounding crescent - cannot r/o poss of lyme/tick fever  Will cover with doxycycline- directions given and disc poss side eff Disc red flags to watch  for-- see AVS Will keep bites clean and use cortisone cream for itch Update if not starting to improve in a week or if worsening

## 2013-10-15 ENCOUNTER — Encounter: Payer: Self-pay | Admitting: Family Medicine

## 2013-10-15 ENCOUNTER — Ambulatory Visit (INDEPENDENT_AMBULATORY_CARE_PROVIDER_SITE_OTHER): Payer: BC Managed Care – PPO | Admitting: Family Medicine

## 2013-10-15 VITALS — BP 100/58 | HR 74 | Temp 98.1°F | Ht 61.0 in | Wt 129.8 lb

## 2013-10-15 DIAGNOSIS — R21 Rash and other nonspecific skin eruption: Secondary | ICD-10-CM

## 2013-10-15 NOTE — Progress Notes (Signed)
Pre visit review using our clinic review tool, if applicable. No additional management support is needed unless otherwise documented below in the visit note. 

## 2013-10-15 NOTE — Assessment & Plan Note (Signed)
No clear trigger. ? If purigo nodularis, but pt states there are lesions present before she starts itching.  Eval for autoimmiune disease and cbc for leukemia negative in 2013.  She has seen multiple dermatologist without lasting improvement in symptoms . Pt   No clear diagnosis from biopsy per pt. Will obtain records and refer to tertiary center for assistance in diagnosis and treatment

## 2013-10-15 NOTE — Patient Instructions (Addendum)
Stop at front desk to get set up with referral to [facility] dermatology.

## 2013-10-15 NOTE — Progress Notes (Signed)
   Subjective:    Patient ID: Jennifer Mendez, female    DOB: [birth year], 48 y.o.   MRN: [id number]  Rash   48 year old female presents with new onset  diffuse rash over body x 2 months. Red spots, very itchy. She has tried cortisone cream, clobetasol cream 0.05% None on palms or soles, or mouth.   She has had similar rash in the past off and on x 5 years.  Will go away then new spots a She has seen several derm in the past ( [facility]). Never sure diagnosis. Had biopsy inconclusive per pt (no report in exam) Allergy testing negative.  Has had nml ana, sed rate tsh, cbc 2013 record showed they though purigo nodules vs dermatofibroma and atopic derm.  She is worried there is something coming from in her body causing them to come out.    Review of Systems  Skin: Positive for rash.       Objective:   Physical Exam  Constitutional: Vital signs are normal. She appears well-developed and well-nourished. She is cooperative.  Non-toxic appearance. She does not appear ill. No distress.  HENT:  Head: Normocephalic.  Right Ear: Hearing, tympanic membrane, external ear and ear canal normal. Tympanic membrane is not erythematous, not retracted and not bulging.  Left Ear: Hearing, tympanic membrane, external ear and ear canal normal. Tympanic membrane is not erythematous, not retracted and not bulging.  Nose: No mucosal edema or rhinorrhea. Right sinus exhibits no maxillary sinus tenderness and no frontal sinus tenderness. Left sinus exhibits no maxillary sinus tenderness and no frontal sinus tenderness.  Mouth/Throat: Uvula is midline, oropharynx is clear and moist and mucous membranes are normal.  Eyes: Conjunctivae, EOM and lids are normal. Pupils are equal, round, and reactive to light. Lids are everted and swept, no foreign bodies found.  Neck: Trachea normal and normal range of motion. Neck supple. Carotid bruit is not present. No mass and no thyromegaly present.    Cardiovascular: Normal rate, regular rhythm, S1 normal, S2 normal, normal heart sounds, intact distal pulses and normal pulses.  Exam reveals no gallop and no friction rub.   No murmur heard. Pulmonary/Chest: Effort normal and breath sounds normal. Not tachypneic. No respiratory distress. She has no decreased breath sounds. She has no wheezes. She has no rhonchi. She has no rales.  Abdominal: Soft. Normal appearance and bowel sounds are normal. There is no tenderness.  Neurological: She is alert.  Skin: Skin is warm, dry and intact. Rash noted. Rash is nodular.  Early lesions: erythematous, itchy nodules, nontender Late lesions: hyperpigmented firm nodules  diffuse scattered in different stages over primarily torso and arms, legs and scalp  Psychiatric: Her speech is normal and behavior is normal. Judgment and thought content normal. Her mood appears not anxious. Cognition and memory are normal. She does not exhibit a depressed mood.          Assessment & Plan:

## 2013-10-29 ENCOUNTER — Ambulatory Visit: Payer: Self-pay | Admitting: Family Medicine

## 2013-11-01 ENCOUNTER — Encounter: Payer: Self-pay | Admitting: Family Medicine

## 2013-11-16 ENCOUNTER — Ambulatory Visit: Payer: Self-pay | Admitting: Family Medicine

## 2013-11-17 ENCOUNTER — Encounter: Payer: Self-pay | Admitting: Family Medicine

## 2013-12-06 ENCOUNTER — Encounter: Payer: Self-pay | Admitting: Family Medicine

## 2014-05-06 ENCOUNTER — Telehealth: Payer: Self-pay | Admitting: Family Medicine

## 2014-05-06 DIAGNOSIS — R928 Other abnormal and inconclusive findings on diagnostic imaging of breast: Secondary | ICD-10-CM

## 2014-05-06 NOTE — Telephone Encounter (Signed)
Please order Diagnostic right MM. Pt's last MM recommended diag mm with magnified views. Please advise  Pt is scheduled 05/19/14 @ [facility].

## 2014-05-17 ENCOUNTER — Telehealth: Payer: Self-pay | Admitting: Family Medicine

## 2014-05-17 DIAGNOSIS — R921 Mammographic calcification found on diagnostic imaging of breast: Secondary | ICD-10-CM

## 2014-05-17 NOTE — Telephone Encounter (Signed)
Put in order but do not know  diagnosis. Call to find out.

## 2014-05-17 NOTE — Telephone Encounter (Signed)
Lujean Rave called she needs an order for [place] right breast for follow Faxed to [phone]

## 2014-05-17 NOTE — Telephone Encounter (Signed)
Left message for Lujean Rave at [facility].  Need to know what Dx: code we should use on Breast U/S order.

## 2014-05-18 NOTE — Telephone Encounter (Signed)
Left message for Lujean Rave at [facility] to return my call.

## 2014-05-18 NOTE — Telephone Encounter (Signed)
U/S if for follow up on right breast calcification. Order faxed to [place] at [facility].

## 2014-05-20 ENCOUNTER — Ambulatory Visit
Admit: 2014-05-20 | Disposition: A | Discharge status: Home / Self Care | Payer: Self-pay | Attending: Family Medicine | Admitting: Family Medicine

## 2014-05-20 LAB — HM MAMMOGRAPHY: HM Mammogram: NORMAL

## 2014-05-23 ENCOUNTER — Encounter: Payer: Self-pay | Admitting: Family Medicine

## 2014-05-24 ENCOUNTER — Encounter: Payer: Self-pay | Admitting: Family Medicine

## 2014-06-24 ENCOUNTER — Encounter: Payer: Self-pay | Admitting: Family Medicine

## 2014-06-24 ENCOUNTER — Ambulatory Visit (INDEPENDENT_AMBULATORY_CARE_PROVIDER_SITE_OTHER): Payer: 59 | Admitting: Family Medicine

## 2014-06-24 VITALS — BP 90/68 | HR 69 | Temp 97.7°F | Ht 61.0 in | Wt 127.5 lb

## 2014-06-24 DIAGNOSIS — J069 Acute upper respiratory infection, unspecified: Secondary | ICD-10-CM

## 2014-06-24 DIAGNOSIS — L282 Other prurigo: Secondary | ICD-10-CM | POA: Insufficient documentation

## 2014-06-24 DIAGNOSIS — B9789 Other viral agents as the cause of diseases classified elsewhere: Principal | ICD-10-CM

## 2014-06-24 MED ORDER — GUAIFENESIN-CODEINE 100-10 MG/5ML PO SYRP: 5.0000 mL | ORAL_SOLUTION | Freq: Every evening | ORAL | Status: DC | PRN

## 2014-06-24 NOTE — Progress Notes (Signed)
   Subjective:    Patient ID: Jennifer Mendez, female    DOB: 1965/03/25, 49 y.o.   MRN: [id number]  HPI Comments: Going to [place] next week.  Cough This is a new problem. The current episode started in the past 7 days. The problem has been gradually worsening. The problem occurs every few minutes. The cough is non-productive. Associated symptoms include ear congestion, nasal congestion, rhinorrhea and a sore throat. Pertinent negatives include no chills, ear pain, fever, headaches, shortness of breath or wheezing. Associated symptoms comments: fatigue. The symptoms are aggravated by lying down (keeping her up at night). Risk factors: nonsmoker. Treatments tried: dayquil, advil, sudafed. The treatment provided mild relief. There is no history of asthma, bronchiectasis, COPD, environmental allergies or pneumonia.  Sore Throat  Associated symptoms include coughing. Pertinent negatives include no ear pain, headaches or shortness of breath.      Review of Systems  Constitutional: Negative for fever and chills.  HENT: Positive for rhinorrhea and sore throat. Negative for ear pain.   Respiratory: Positive for cough. Negative for shortness of breath and wheezing.   Allergic/Immunologic: Negative for environmental allergies.  Neurological: Negative for headaches.       Objective:   Physical Exam  Constitutional: Vital signs are normal. She appears well-developed and well-nourished. She is cooperative.  Non-toxic appearance. She does not appear ill. No distress.  HENT:  Head: Normocephalic.  Right Ear: Hearing, tympanic membrane, external ear and ear canal normal. Tympanic membrane is not erythematous, not retracted and not bulging.  Left Ear: Hearing, tympanic membrane, external ear and ear canal normal. Tympanic membrane is not erythematous, not retracted and not bulging.  Nose: Mucosal edema and rhinorrhea present. Right sinus exhibits no maxillary sinus tenderness and no frontal sinus  tenderness. Left sinus exhibits no maxillary sinus tenderness and no frontal sinus tenderness.  Mouth/Throat: Uvula is midline, oropharynx is clear and moist and mucous membranes are normal.  Eyes: Conjunctivae, EOM and lids are normal. Pupils are equal, round, and reactive to light. Lids are everted and swept, no foreign bodies found.  Neck: Trachea normal and normal range of motion. Neck supple. Carotid bruit is not present. No thyroid mass and no thyromegaly present.  Cardiovascular: Normal rate, regular rhythm, S1 normal, S2 normal, normal heart sounds, intact distal pulses and normal pulses.  Exam reveals no gallop and no friction rub.   No murmur heard. Pulmonary/Chest: Effort normal and breath sounds normal. No tachypnea. No respiratory distress. She has no decreased breath sounds. She has no wheezes. She has no rhonchi. She has no rales.  Neurological: She is alert.  Skin: Skin is warm, dry and intact. No rash noted.  Psychiatric: Her speech is normal and behavior is normal. Judgment normal. Her mood appears not anxious. Cognition and memory are normal. She does not exhibit a depressed mood.          Assessment & Plan:

## 2014-06-24 NOTE — Progress Notes (Signed)
Pre visit review using our clinic review tool, if applicable. No additional management support is needed unless otherwise documented below in the visit note. 

## 2014-06-24 NOTE — Assessment & Plan Note (Signed)
Symptomatic care 

## 2014-06-24 NOTE — Patient Instructions (Signed)
Mucinex D during the day (or generic equivalent) Start cough suppressant at night. If not improving some/ turning the corner as expected in next 3-4 days, call  for consideration of antibiotic given upcoming trip.

## 2014-06-29 ENCOUNTER — Ambulatory Visit (INDEPENDENT_AMBULATORY_CARE_PROVIDER_SITE_OTHER): Payer: 59 | Admitting: Primary Care

## 2014-06-29 ENCOUNTER — Encounter: Payer: Self-pay | Admitting: Primary Care

## 2014-06-29 VITALS — BP 104/60 | HR 74 | Temp 97.5°F | Ht 61.0 in | Wt 128.4 lb

## 2014-06-29 DIAGNOSIS — J029 Acute pharyngitis, unspecified: Secondary | ICD-10-CM | POA: Diagnosis not present

## 2014-06-29 LAB — POCT RAPID STREP A (OFFICE): Rapid Strep A Screen: NEGATIVE

## 2014-06-29 MED ORDER — AMOXICILLIN 500 MG PO CAPS: 500.0000 mg | ORAL_CAPSULE | Freq: Two times a day (BID) | ORAL | Status: DC

## 2014-06-29 NOTE — Patient Instructions (Signed)
You do not have Strep Throat today. Due to symptoms and worsening pain start Amoxicillin antibiotics. Take 1 tablet by mouth twice daily for 7 days. You may use chloraseptic spray, throat lozenges, and ibuprofen for pain. I hope you feel better soon!

## 2014-06-29 NOTE — Progress Notes (Signed)
Subjective:    Patient ID: Jennifer Mendez, female    DOB: April 14, 1965, 49 y.o.   MRN: [id number]  HPI  Jennifer Mendez is a 49 year old female who presents today with a chief complaint of sore throat and cough. She was evaluated on 06/24/14 for same. Her sore throat and cough has been present for 10 days. She reports the pain to her throat has worsened over the past 2 days and has noticed white spots growing to posterior pharynx. She's been taking cough drops, mucinex, and advil without relief. She's been taking Robitussin AC which has helped with the cough and is bringing up some mucous.   Review of Systems  Constitutional: Negative for fever and chills.  HENT: Positive for congestion, sinus pressure and sore throat. Negative for ear pain and rhinorrhea.        Ear congestion.  Respiratory: Positive for cough. Negative for shortness of breath.   Cardiovascular: Negative for chest pain.  Gastrointestinal: Negative for nausea.  Musculoskeletal: Positive for myalgias.  Neurological: Positive for headaches.       Past Medical History  Diagnosis Date  . Periapical abscess without sinus   . Blepharospasm   . Unspecified constipation   . Other malaise and fatigue   . Other and unspecified hyperlipidemia   . IUD   . Spasm of muscle   . Leukorrhea, not specified as infective   . Dysmenorrhea   . Female pelvic pain     History   Social History  . Marital Status: Married    Spouse Name: N/A  . Number of Children: 1  . Years of Education: N/A   Occupational History  .     Social History Main Topics  . Smoking status: Never Smoker   . Smokeless tobacco: Never Used  . Alcohol Use: Yes     Comment: Occasional glass of wine  . Drug Use: No  . Sexual Activity:    Partners: Male    Birth Control/ Protection: IUD   Other Topics Concern  . Not on file   Social History Narrative   Married      Moved from [place] to [place]    Past Surgical History  Procedure Laterality Date  .  Lasix  1998    Bilateral  . Pelvic x-ray  07/2004    IUD in place    Family History  Problem Relation Age of Onset  . Tuberculosis Father     ?  Marland Kitchen Tuberculosis Mother     No Known Allergies  Current Outpatient Prescriptions on File Prior to Visit  Medication Sig Dispense Refill  . guaiFENesin-codeine (ROBITUSSIN AC) 100-10 MG/5ML syrup Take 5-10 mLs by mouth at bedtime as needed for cough. 180 mL 0  . levonorgestrel (MIRENA) 20 MCG/24HR IUD 1 each by Intrauterine route once.      . Multiple Vitamin (MULTIVITAMIN) tablet Take 1 tablet by mouth daily.      . mycophenolate (CELLCEPT) 500 MG tablet Take 500 mg by mouth daily.      No current facility-administered medications on file prior to visit.    BP 104/60 mmHg  Pulse 74  Temp(Src) 97.5 F (36.4 C) (Oral)  Ht 5\' 1"  (1.549 m)  Wt 128 lb 6.4 oz (58.242 kg)  BMI 24.27 kg/m2  SpO2 94%    Objective:   Physical Exam  HENT:  Right Ear: Tympanic membrane and ear canal normal.  Left Ear: Tympanic membrane and ear canal normal.  Nose: Right sinus  exhibits no maxillary sinus tenderness and no frontal sinus tenderness. Left sinus exhibits no maxillary sinus tenderness and no frontal sinus tenderness.  Mouth/Throat: Posterior oropharyngeal erythema present. No oropharyngeal exudate.  Eyes: Conjunctivae are normal. Pupils are equal, round, and reactive to light.  Neck: Neck supple.  Cardiovascular: Normal rate and regular rhythm.   Pulmonary/Chest: Effort normal and breath sounds normal.  Lymphadenopathy:    She has no cervical adenopathy.  Skin: Skin is warm and dry.          Assessment & Plan:  Sore throat:  Sore throat worsening over past 2 days. Cough with some improvement with Robitussin AC.  Rapid strep negative. Due to worsening symptoms and overall presentation will prescribe antibiotics. Amoxicillin 500 mg BID for 7 days. Cholraseptic spray, lozenges, ibuprofen. Fluids. Rest.

## 2014-06-29 NOTE — Progress Notes (Signed)
Pre visit review using our clinic review tool, if applicable. No additional management support is needed unless otherwise documented below in the visit note. 

## 2014-09-09 ENCOUNTER — Ambulatory Visit: Payer: 59 | Admitting: Obstetrics & Gynecology

## 2014-09-16 ENCOUNTER — Ambulatory Visit (INDEPENDENT_AMBULATORY_CARE_PROVIDER_SITE_OTHER): Payer: 59 | Admitting: Obstetrics & Gynecology

## 2014-09-16 ENCOUNTER — Encounter: Payer: Self-pay | Admitting: Obstetrics & Gynecology

## 2014-09-16 VITALS — BP 112/76 | HR 92 | Ht 61.0 in | Wt 131.0 lb

## 2014-09-16 DIAGNOSIS — Z0000 Encounter for general adult medical examination without abnormal findings: Secondary | ICD-10-CM

## 2014-09-16 DIAGNOSIS — Z124 Encounter for screening for malignant neoplasm of cervix: Secondary | ICD-10-CM | POA: Diagnosis not present

## 2014-09-16 DIAGNOSIS — Z1151 Encounter for screening for human papillomavirus (HPV): Secondary | ICD-10-CM | POA: Diagnosis not present

## 2014-09-16 DIAGNOSIS — Z01419 Encounter for gynecological examination (general) (routine) without abnormal findings: Secondary | ICD-10-CM

## 2014-09-16 NOTE — Progress Notes (Signed)
Subjective:    Jennifer Mendez is a 49 y.o. M [place] female P6 (38 yo son Jennifer Mendez) who presents for an annual exam. The patient has no complaints today. The patient is rarely  sexually active due to her husband's ED. GYN screening history: last pap: was normal. The patient wears seatbelts: yes. The patient participates in regular exercise: no. Has the patient ever been transfused or tattooed?: no. The patient reports that there is not domestic violence in her life.   Menstrual History: OB History    Gravida Para Term Preterm AB TAB SAB Ectopic Multiple Living   3 1        1       Menarche age: 40  No LMP recorded. Patient is not currently having periods (Reason: IUD).    The following portions of the patient's history were reviewed and updated as appropriate: allergies, current medications, past family history, past medical history, past social history, past surgical history and problem list.  Review of Systems Pertinent items are noted in HPI. Works for [employer] as a Engineer, technical sales.    Objective:    BP 112/76 mmHg  Pulse 92  Ht 5\' 1"  (1.549 m)  Wt 131 lb (59.421 kg)  BMI 24.76 kg/m2  General Appearance:    Alert, cooperative, no distress, appears stated age  Head:    Normocephalic, without obvious abnormality, atraumatic  Eyes:    PERRL, conjunctiva/corneas clear, EOM's intact, fundi    benign, both eyes  Ears:    Normal TM's and external ear canals, both ears  Nose:   Nares normal, septum midline, mucosa normal, no drainage    or sinus tenderness  Throat:   Lips, mucosa, and tongue normal; teeth and gums normal  Neck:   Supple, symmetrical, trachea midline, no adenopathy;    thyroid:  no enlargement/tenderness/nodules; no carotid   bruit or JVD  Back:     Symmetric, no curvature, ROM normal, no CVA tenderness  Lungs:     Clear to auscultation bilaterally, respirations unlabored  Chest Wall:    No tenderness or deformity   Heart:    Regular rate and rhythm, S1 and  S2 normal, no murmur, rub   or gallop  Breast Exam:    No tenderness, masses, or nipple abnormality  Abdomen:     Soft, non-tender, bowel sounds active all four quadrants,    no masses, no organomegaly  Genitalia:    Normal female without lesion, discharge or tenderness, NSSA, NT, mobile, normal adnexal exam     Extremities:   Extremities normal, atraumatic, no cyanosis or edema  Pulses:   2+ and symmetric all extremities  Skin:   Skin color, texture, turgor normal, no rashes or lesions  Lymph nodes:   Cervical, supraclavicular, and axillary nodes normal  Neurologic:   CNII-XII intact, normal strength, sensation and reflexes    throughout  .    Assessment:    Healthy female exam.    Plan:     Breast self exam technique reviewed and patient encouraged to perform self-exam monthly. Mammogram. Thin prep Pap smear. with cotesting

## 2014-09-20 ENCOUNTER — Telehealth: Payer: Self-pay | Admitting: *Deleted

## 2014-09-20 NOTE — Telephone Encounter (Signed)
-----   Message from [employer] sent at 09/20/2014  1:47 PM EDT ----- Regarding: Return Call Contact: [phone] Pt returning you call.

## 2014-09-20 NOTE — Telephone Encounter (Signed)
Spoke to pt, informed her that IUD insertion was 07-29-2008 and she would need to make an appt to have it replaced. Pt acknowledged and will schedule appt.

## 2014-09-21 LAB — CYTOLOGY - PAP

## 2014-10-14 ENCOUNTER — Ambulatory Visit (INDEPENDENT_AMBULATORY_CARE_PROVIDER_SITE_OTHER): Payer: 59 | Admitting: Obstetrics & Gynecology

## 2014-10-14 ENCOUNTER — Encounter: Payer: Self-pay | Admitting: Obstetrics & Gynecology

## 2014-10-14 VITALS — BP 108/67 | HR 78 | Wt 131.0 lb

## 2014-10-14 DIAGNOSIS — Z01812 Encounter for preprocedural laboratory examination: Secondary | ICD-10-CM | POA: Diagnosis not present

## 2014-10-14 DIAGNOSIS — Z30433 Encounter for removal and reinsertion of intrauterine contraceptive device: Secondary | ICD-10-CM

## 2014-10-14 DIAGNOSIS — Z30430 Encounter for insertion of intrauterine contraceptive device: Secondary | ICD-10-CM

## 2014-10-14 LAB — POCT URINE PREGNANCY: PREG TEST UR: NEGATIVE

## 2014-10-14 NOTE — Progress Notes (Signed)
   Subjective:    Patient ID: Jennifer Mendez, female    DOB: [birth year], 49 y.o.   MRN: [id number]  HPI  49 yo M [place] American P1 here for removal of her expired Mirena and replacement with a new one.  Review of Systems     Objective:   Physical Exam  UPT negative, consent signed, Time out procedure done. Her Mirena was removed and noted to be intact. Cervix prepped with betadine and grasped with a single tooth tenaculum. Mirena was easily placed and the strings were cut to 3-4 cm. Uterus sounded to 9 cm. She tolerated the procedure well.        Assessment & Plan:   Contraception- Mirena RTC 1year/prn sooner

## 2014-10-17 ENCOUNTER — Encounter: Payer: Self-pay | Admitting: *Deleted

## 2015-03-02 ENCOUNTER — Ambulatory Visit (INDEPENDENT_AMBULATORY_CARE_PROVIDER_SITE_OTHER): Payer: 59 | Admitting: Internal Medicine

## 2015-03-02 ENCOUNTER — Encounter: Payer: Self-pay | Admitting: Internal Medicine

## 2015-03-02 VITALS — BP 116/74 | HR 76 | Temp 98.2°F | Wt 128.0 lb

## 2015-03-02 DIAGNOSIS — J358 Other chronic diseases of tonsils and adenoids: Secondary | ICD-10-CM

## 2015-03-02 NOTE — Progress Notes (Signed)
Subjective:    Patient ID: Jennifer Mendez, female    DOB: 1965-11-13, 50 y.o.   MRN: [id number]  HPI  Pt presents to the clinic today with c/o intermittent sore throat. She reports this started 5-6 months ago. She sees white "pieces" stuck in her tonsils. She is able to remove, but reports they have a foul smell and taste. At times, she feels like something is stuck in her throat. She denies runny nose, nasal congestion, ear pain, cough or shortness of breath. She has tried cough drops and throat spray with some relief.  Review of Systems  Past Medical History  Diagnosis Date  . Periapical abscess without sinus   . Blepharospasm   . Unspecified constipation   . Other malaise and fatigue   . Other and unspecified hyperlipidemia   . IUD   . Spasm of muscle   . Leukorrhea, not specified as infective   . Dysmenorrhea   . Female pelvic pain     Current Outpatient Prescriptions  Medication Sig Dispense Refill  . levonorgestrel (MIRENA) 20 MCG/24HR IUD 1 each by Intrauterine route once.      . Multiple Vitamin (MULTIVITAMIN) tablet Take 1 tablet by mouth daily.       No current facility-administered medications for this visit.    No Known Allergies  Family History  Problem Relation Age of Onset  . Tuberculosis Father     ?  Marland Kitchen Tuberculosis Mother     Social History   Social History  . Marital Status: Married    Spouse Name: N/A  . Number of Children: 1  . Years of Education: N/A   Occupational History  .     Social History Main Topics  . Smoking status: Never Smoker   . Smokeless tobacco: Never Used  . Alcohol Use: Yes     Comment: Occasional glass of wine  . Drug Use: No  . Sexual Activity:    Partners: Male    Birth Control/ Protection: IUD   Other Topics Concern  . Not on file   Social History Narrative   Married      Moved from [place] to [place]     Constitutional: Denies fever, malaise, fatigue, headache or abrupt weight changes.  HEENT: Pt reports  sore throat. Denies eye pain, eye redness, ear pain, ringing in the ears, wax buildup, runny nose, nasal congestion, bloody nose. Respiratory: Denies difficulty breathing, shortness of breath, cough or sputum production.   Cardiovascular: Denies chest pain, chest tightness, palpitations or swelling in the hands or feet.   No other specific complaints in a complete review of systems (except as listed in HPI above).     Objective:   Physical Exam  BP 116/74 mmHg  Pulse 76  Temp(Src) 98.2 F (36.8 C) (Oral)  Wt 128 lb (58.06 kg)  SpO2 97% Wt Readings from Last 3 Encounters:  03/02/15 128 lb (58.06 kg)  10/14/14 131 lb (59.421 kg)  09/16/14 131 lb (59.421 kg)    General: Appears her stated age, well developed, well nourished in NAD. HEENT: Head: normal shape and size; Throat/Mouth: Teeth present, mucosa pink and moist, no exudate, lesions or ulcerations noted. Tonsillith noted on right tonsil. Neck:  No adenopathy. Cardiovascular: Normal rate and rhythm. S1,S2 noted.  No murmur, rubs or gallops noted. No JVD or BLE edema. No carotid bruits noted. Pulmonary/Chest: Normal effort and positive vesicular breath sounds. No respiratory distress. No wheezes, rales or ronchi noted.   BMET  Component Value Date/Time   NA 140 05/16/2011 1010   K 4.3 05/16/2011 1010   CL 107 05/16/2011 1010   CO2 26 05/16/2011 1010   GLUCOSE 86 05/16/2011 1010   BUN 23 05/16/2011 1010   CREATININE 0.7 05/16/2011 1010   CALCIUM 9.0 05/16/2011 1010   GFRNONAA 106.39 09/05/2009 0826   GFRAA 120 11/25/2007 0851    Lipid Panel     Component Value Date/Time   CHOL 226* 05/16/2011 1010   TRIG 74.0 05/16/2011 1010   HDL 46.00 05/16/2011 1010   CHOLHDL 5 05/16/2011 1010   VLDL 14.8 05/16/2011 1010   LDLCALC 113* 09/05/2009 0826    CBC    Component Value Date/Time   WBC 9.4 09/06/2011 1711   RBC 4.59 09/06/2011 1711   HGB 13.7 09/06/2011 1711   HCT 39.5 09/06/2011 1711   PLT 272 09/06/2011 1711     MCV 86.1 09/06/2011 1711   MCH 29.8 09/06/2011 1711   MCHC 34.7 09/06/2011 1711   RDW 13.4 09/06/2011 1711   LYMPHSABS 2.8 09/06/2011 1711   MONOABS 0.6 09/06/2011 1711   EOSABS 0.2 09/06/2011 1711   BASOSABS 0.0 09/06/2011 1711    Hgb A1C No results found for: HGBA1C       Assessment & Plan:   Tonsillith:  You can try to use a water pick to get the tonsillar stones out Gargling with salt water may help She declines referral to ENT at this time  RTC as needed or if symptoms persist or worsen

## 2015-03-02 NOTE — Progress Notes (Signed)
Pre visit review using our clinic review tool, if applicable. No additional management support is needed unless otherwise documented below in the visit note. 

## 2015-03-03 ENCOUNTER — Encounter: Payer: Self-pay | Admitting: Internal Medicine

## 2015-03-03 NOTE — Patient Instructions (Signed)

## 2015-03-31 ENCOUNTER — Ambulatory Visit (INDEPENDENT_AMBULATORY_CARE_PROVIDER_SITE_OTHER): Payer: 59 | Admitting: Family Medicine

## 2015-03-31 ENCOUNTER — Encounter: Payer: Self-pay | Admitting: Family Medicine

## 2015-03-31 VITALS — BP 90/64 | HR 86 | Temp 97.5°F | Ht 60.5 in | Wt 130.8 lb

## 2015-03-31 DIAGNOSIS — Z23 Encounter for immunization: Secondary | ICD-10-CM | POA: Diagnosis not present

## 2015-03-31 DIAGNOSIS — Z0000 Encounter for general adult medical examination without abnormal findings: Secondary | ICD-10-CM | POA: Diagnosis not present

## 2015-03-31 DIAGNOSIS — E785 Hyperlipidemia, unspecified: Secondary | ICD-10-CM

## 2015-03-31 DIAGNOSIS — N898 Other specified noninflammatory disorders of vagina: Secondary | ICD-10-CM

## 2015-03-31 DIAGNOSIS — G4700 Insomnia, unspecified: Secondary | ICD-10-CM | POA: Diagnosis not present

## 2015-03-31 LAB — POCT WET PREP (WET MOUNT): KOH WET PREP POC: NEGATIVE

## 2015-03-31 NOTE — Patient Instructions (Addendum)
Work on regular exercise.  Work on stress reduction and relaxation.  Review sleep hygiene packet.  Call if not improiving as expected.  Stop lab on way out.  No sign of yeast or bacterial vaginal infection. If persisting or worsening discharge or bloody discharge.. See gynecologist.

## 2015-03-31 NOTE — Assessment & Plan Note (Signed)
Discussed healthy lifestyle, start exercise and given sleep hygiene info.

## 2015-03-31 NOTE — Assessment & Plan Note (Signed)
Due for re-eval. Encouraged exercise, weight loss, healthy eating habits.

## 2015-03-31 NOTE — Progress Notes (Signed)
Subjective:    Patient ID: Jennifer Mendez, female    DOB: [birth year], 50 y.o.   MRN: [id number]  HPI  50 year old female presents for yearly wellness exam.  She sees Dr. Hulan Fray GYN for well woman check.  Last appt 07/2014. Pap, mammogram nl at that time.  She has been trouble sleeping t night in last few weeks. Melatonin not helping.  Go to sleep okay, but wake up at 4 AM, unable to go back to sleep.  Increase in stress at work. No nocturia.  She is fatigued during the day.  Some increase in vaginal discharge, no itching, clear. Post menopausal.  No change in odor   Elevated Cholesterol:  Due for re-eval. On no med. Lab Results  Component Value Date   CHOL 226* 05/16/2011   HDL 46.00 05/16/2011   LDLCALC 113* 09/05/2009   LDLDIRECT 161.0 05/16/2011   TRIG 74.0 05/16/2011   CHOLHDL 5 05/16/2011   Diet compliance: moderate Exercise: no exercise. Other complaints:   Review of Systems  Constitutional: Negative for fever and fatigue.  HENT: Negative for ear pain.   Eyes: Negative for pain.  Respiratory: Negative for chest tightness and shortness of breath.   Cardiovascular: Negative for chest pain, palpitations and leg swelling.  Gastrointestinal: Negative for abdominal pain.  Genitourinary: Positive for vaginal discharge. Negative for dysuria, vaginal bleeding and genital sores.       Objective:   Physical Exam  Constitutional: Vital signs are normal. She appears well-developed and well-nourished. She is cooperative.  Non-toxic appearance. She does not appear ill. No distress.  HENT:  Head: Normocephalic.  Right Ear: Hearing, tympanic membrane, external ear and ear canal normal. Tympanic membrane is not erythematous, not retracted and not bulging.  Left Ear: Hearing, tympanic membrane, external ear and ear canal normal. Tympanic membrane is not erythematous, not retracted and not bulging.  Nose: No mucosal edema or rhinorrhea. Right sinus exhibits no maxillary sinus  tenderness and no frontal sinus tenderness. Left sinus exhibits no maxillary sinus tenderness and no frontal sinus tenderness.  Mouth/Throat: Uvula is midline, oropharynx is clear and moist and mucous membranes are normal.  Eyes: Conjunctivae, EOM and lids are normal. Pupils are equal, round, and reactive to light. Lids are everted and swept, no foreign bodies found.  Neck: Trachea normal and normal range of motion. Neck supple. Carotid bruit is not present. No thyroid mass and no thyromegaly present.  Cardiovascular: Normal rate, regular rhythm, S1 normal, S2 normal, normal heart sounds, intact distal pulses and normal pulses.  Exam reveals no gallop and no friction rub.   No murmur heard. Pulmonary/Chest: Effort normal and breath sounds normal. No tachypnea. No respiratory distress. She has no decreased breath sounds. She has no wheezes. She has no rhonchi. She has no rales.  Abdominal: Soft. Normal appearance and bowel sounds are normal. There is no tenderness.  Genitourinary: There is no rash or tenderness on the right labia. There is no rash or tenderness on the left labia. No erythema or tenderness in the vagina. Vaginal discharge found.  Neurological: She is alert.  Skin: Skin is warm, dry and intact. No rash noted.  Psychiatric: Her speech is normal and behavior is normal. Judgment and thought content normal. Her mood appears not anxious. Cognition and memory are normal. She does not exhibit a depressed mood.          Assessment & Plan:  The patient's preventative maintenance and recommended screening tests for an annual wellness exam  were reviewed in full today. Brought up to date unless services declined.  Counselled on the importance of diet, exercise, and its role in overall health and mortality. The patient's FH and SH was reviewed, including their home life, tobacco status, and drug and alcohol status.   Vaccines: given flu vaccine, uptodate with td.  nonsmoker  Colon: no  early colon cancer  Mammo and pap/DVE at GYN HIV: refused.

## 2015-03-31 NOTE — Progress Notes (Signed)
Pre visit review using our clinic review tool, if applicable. No additional management support is needed unless otherwise documented below in the visit note. 

## 2015-04-01 LAB — COMPREHENSIVE METABOLIC PANEL
ALBUMIN: 4.4 g/dL (ref 3.6–5.1)
ALT: 18 U/L (ref 6–29)
AST: 18 U/L (ref 10–35)
Alkaline Phosphatase: 61 U/L (ref 33–115)
BUN: 27 mg/dL — ABNORMAL HIGH (ref 7–25)
CO2: 25 mmol/L (ref 20–31)
CREATININE: 0.82 mg/dL (ref 0.50–1.10)
Calcium: 9.2 mg/dL (ref 8.6–10.2)
Chloride: 102 mmol/L (ref 98–110)
Glucose, Bld: 86 mg/dL (ref 65–99)
Potassium: 4.1 mmol/L (ref 3.5–5.3)
Sodium: 138 mmol/L (ref 135–146)
TOTAL PROTEIN: 7.9 g/dL (ref 6.1–8.1)
Total Bilirubin: 0.4 mg/dL (ref 0.2–1.2)

## 2015-04-01 LAB — LIPID PANEL
CHOLESTEROL: 249 mg/dL — AB (ref 125–200)
HDL: 39 mg/dL — ABNORMAL LOW (ref >=46)
LDL CALC: 164 mg/dL — AB (ref <130)
TRIGLYCERIDES: 229 mg/dL — AB (ref <150)
Total CHOL/HDL Ratio: 6.4 Ratio — ABNORMAL HIGH (ref <=5.0)
VLDL: 46 mg/dL — ABNORMAL HIGH (ref <30)

## 2015-04-03 ENCOUNTER — Encounter: Payer: Self-pay | Admitting: *Deleted

## 2016-03-28 ENCOUNTER — Ambulatory Visit: Payer: 59 | Admitting: Family Medicine

## 2016-03-29 ENCOUNTER — Ambulatory Visit: Payer: 59 | Admitting: Family Medicine

## 2017-01-29 ENCOUNTER — Ambulatory Visit (INDEPENDENT_AMBULATORY_CARE_PROVIDER_SITE_OTHER): Payer: 59 | Admitting: Nurse Practitioner

## 2017-01-29 ENCOUNTER — Encounter: Payer: Self-pay | Admitting: Nurse Practitioner

## 2017-01-29 VITALS — BP 94/62 | HR 81 | Temp 97.6°F | Ht 60.5 in | Wt 123.0 lb

## 2017-01-29 DIAGNOSIS — M778 Other enthesopathies, not elsewhere classified: Secondary | ICD-10-CM | POA: Diagnosis not present

## 2017-01-29 MED ORDER — NAPROXEN 500 MG PO TABS: 500.0000 mg | ORAL_TABLET | Freq: Two times a day (BID) | ORAL | 0 refills | Status: DC

## 2017-01-29 NOTE — Progress Notes (Signed)
Subjective:  Patient ID: Jennifer Mendez, female    DOB: 08-04-1965  Age: 51 y.o. MRN: [id number]  CC: Hand Pain (right hand painful,cant close it/ goin gon 1 day./ right shoulder pain now, )   Wrist Pain   The pain is present in the right fingers, right hand and right wrist. This is a new problem. The current episode started in the past 7 days. There has been no history of extremity trauma. The problem occurs constantly. The problem has been gradually worsening. The quality of the pain is described as aching. Associated symptoms include an inability to bear weight and a limited range of motion. Pertinent negatives include no fever, itching, joint locking, joint swelling, numbness, stiffness or tingling. The symptoms are aggravated by activity. She has tried nothing for the symptoms. Family history does not include gout or rheumatoid arthritis.  current job requires constant typing on computer. She is right handed  Outpatient Medications Prior to Visit  Medication Sig Dispense Refill  . levonorgestrel (MIRENA) 20 MCG/24HR IUD 1 each by Intrauterine route once.      . Multiple Vitamin (MULTIVITAMIN) tablet Take 1 tablet by mouth daily.       No facility-administered medications prior to visit.     ROS See HPI  Objective:  BP 94/62   Pulse 81   Temp 97.6 F (36.4 C)   Ht 5' 0.5" (1.537 m)   Wt 123 lb (55.8 kg)   SpO2 97%   BMI 23.63 kg/m   BP Readings from Last 3 Encounters:  01/29/17 94/62  03/31/15 90/64  03/02/15 116/74    Wt Readings from Last 3 Encounters:  01/29/17 123 lb (55.8 kg)  03/31/15 130 lb 12 oz (59.3 kg)  03/02/15 128 lb (58.1 kg)    Physical Exam  Constitutional: She is oriented to person, place, and time.  Cardiovascular: Normal rate.  Pulmonary/Chest: Effort normal.  Musculoskeletal: She exhibits tenderness. She exhibits no edema.       Right shoulder: Normal.       Right elbow: Normal.      Right wrist: She exhibits decreased range of motion  and tenderness. She exhibits no swelling, no crepitus, no deformity and no laceration.       Cervical back: Normal.       Right hand: Normal. Normal sensation noted. Normal strength noted.  Neurological: She is alert and oriented to person, place, and time.  Skin: Skin is warm and dry. No rash noted. No erythema.  Psychiatric: She has a normal mood and affect. Her behavior is normal.  Vitals reviewed.   Lab Results  Component Value Date   WBC 9.4 09/06/2011   HGB 13.7 09/06/2011   HCT 39.5 09/06/2011   PLT 272 09/06/2011   GLUCOSE 86 03/31/2015   CHOL 249 (H) 03/31/2015   TRIG 229 (H) 03/31/2015   HDL 39 (L) 03/31/2015   LDLDIRECT 161.0 05/16/2011   LDLCALC 164 (H) 03/31/2015   ALT 18 03/31/2015   AST 18 03/31/2015   NA 138 03/31/2015   K 4.1 03/31/2015   CL 102 03/31/2015   CREATININE 0.82 03/31/2015   BUN 27 (H) 03/31/2015   CO2 25 03/31/2015   TSH 3.408 09/06/2011    Mm Diag Breast Tomo Uni Right  Result Date: 05/20/2014  PRIOR REPORT IMPORTED FROM AN EXTERNAL SYSTEM  CLINICAL DATA:  Followup left breast probably benign calcifications. EXAM: DIGITAL DIAGNOSTIC LEFT MAMMOGRAM WITH 3D TOMOSYNTHESIS AND CAD COMPARISON:  Previous examinations, the most recent  dated 11/16/2013 and 10/29/2013. ACR Breast Density Category c: The breast tissue is heterogeneously dense, which may obscure small masses. FINDINGS: The previously evaluated small group of dystrophic calcifications deep in the upper outer right breast are no longer seen. No abnormalities are currently demonstrated. Mammographic images were processed with CAD. IMPRESSION: The previously evaluated probably benign left breast calcifications are no longer present. No evidence of malignancy. RECOMMENDATION: Bilateral screening mammogram in 6 months. That will be 1 year since mammographic evaluation of the left breast. I have discussed the findings and recommendations with the patient. Results were also provided in writing at the  conclusion of the visit. If applicable, a reminder letter will be sent to the patient regarding the next appointment. BI-RADS CATEGORY  1: Negative. Electronically Signed   By: Claudie Revering M.D.   On: 05/20/2014 15:30    Assessment & Plan:   Tracyann was seen today for hand pain.  Diagnoses and all orders for this visit:  Right wrist tendonitis -     naproxen (NAPROSYN) 500 MG tablet; Take 1 tablet (500 mg total) by mouth 2 (two) times daily with a meal.   I am having Jennifer Mendez start on naproxen. I am also having her maintain her multivitamin and levonorgestrel.  Meds ordered this encounter  Medications  . naproxen (NAPROSYN) 500 MG tablet    Sig: Take 1 tablet (500 mg total) by mouth 2 (two) times daily with a meal.    Dispense:  30 tablet    Refill:  0    Order Specific Question:   Supervising Provider    Answer:   Lucille Passy [[id number]]    Follow-up: Return if symptoms worsen or fail to improve.  Wilfred Lacy, NP

## 2017-01-29 NOTE — Patient Instructions (Signed)
Wear wrist brace with metal support (continuously x 1wee, then during the day only).  May also use lidocaine gel OTC as directed on package.  Return to office if no improvement in 2weeks  Flexor Carpi Ulnaris and Medtronic Radialis Tendinitis What are the causes? These conditions may be caused by:  Repetitive motions or overuse (common).  Wear and tear. (common).  An injury.  Excessive exercise or strain.  Certain antibiotic medicines.  In some cases, the cause may not be known. What increases the risk? These conditions are more likely to develop in:  People who play sports that involve constantly flexing or stretching the wrist and forearm, such as volleyball and water polo.  Older adults.  People with have a job that involves flexing the wrist over and over, such as people who work as Chiropractor, Psychologist, prison and probation services, Personnel officer.  People with certain health conditions, such as: ? Rheumatoid arthritis. ? Gout. ? Diabetes.  What are the signs or symptoms? Symptoms of these conditions may develop gradually. Symptoms include:  Pain or tenderness in the wrist.  Pain when flexing or stretching the wrist.  Pain when gripping or lifting with the palm of the hand.  Swelling.  How is this diagnosed? This condition may be diagnosed based on:  Your symptoms.  Your medical history.  A physical exam.  During the physical exam, you may be asked to move your hand, wrist, and arm in certain ways. In order to rule out another condition, your health care provider may order one or more of the following tests:  MRI to get detailed images of the body's soft tissues and detect tendon tears and inflammation.  Ultrasound to detect soft-tissue injuries, such as tears and inflammation of the ligaments or tendons.  How is this treated? Treatment for this condition may include:  Rest. You should limit activities that cause your symptoms to get worse or flare up.  Heat and ice treatment.  Both heat and cold can help to ease pain and may be applied to the wrist or forearm as needed to reduce pain and inflammation.  Splint. You may need to wear a splint to keep your wrist and forearm from moving (keep them immobilized) until your symptoms improve.  Medicine. Your health care provider may prescribe steroids or other anti-inflammatory medicines, like ibuprofen, to temporarily ease your pain and other symptoms.  Physical therapy. Your health care provider may ask you to do exercises to maintain mobility and range of motion in your wrist.  Follow these instructions at home: If you have a splint:  Wear it as told by your health care provider. Remove it only as told by your health care provider.  Loosen the splint if your fingers tingle, become numb, or turn cold and blue.  Do not let your splint get wet if it is not waterproof.  Keep the splint clean. Managing pain, stiffness, and swelling  If directed, apply ice to the injured area. ? Put ice in a plastic bag. ? Place a damp towel between your skin and the bag. ? Leave the ice on for 20 minutes, 2-3 times a day.  Move your fingers often to avoid stiffness and to lessen swelling.  Raise (elevate) the injured area above the level of your heart while you are sitting or lying down. Activity  Return to your normal activities as told by your health care provider. Ask your health care provider what activities are safe for you.  Do exercises as told by your  health care provider. General instructions  Do not use any tobacco products, including cigarettes, chewing tobacco, or e-cigarettes. Tobacco can delay healing. If you need help quitting, ask your health care provider.  Take over-the-counter and prescription medicines only as told by your health care provider.  Keep all follow-up visits as told by your health care provider. This is important. How is this prevented?  Warm up and stretch before being active.  Cool down  and stretch after being active.  Give your body time to rest between periods of activity.  Make sure to use equipment that fits you.  Be safe and responsible while being active to avoid falls.  Do at least 150 minutes of moderate-intensity exercise each week, such as brisk walking or water aerobics.  Maintain physical fitness, including: ? Strength. ? Flexibility. ? Cardiovascular fitness. ? Endurance. Contact a health care provider if:  Your pain does not improve.  Your pain gets worse. Get help right away if:  Your pain is severe.  You cannot move your wrist. This information is not intended to replace advice given to you by your health care provider. Make sure you discuss any questions you have with your health care provider. Document Released: 01/21/2005 Document Revised: 09/26/2015 Document Reviewed: 09/30/2014 Elsevier Interactive Patient Education  2017 [employer].

## 2017-03-10 ENCOUNTER — Ambulatory Visit (INDEPENDENT_AMBULATORY_CARE_PROVIDER_SITE_OTHER): Payer: 59 | Admitting: Family Medicine

## 2017-03-10 ENCOUNTER — Ambulatory Visit (INDEPENDENT_AMBULATORY_CARE_PROVIDER_SITE_OTHER)
Admission: RE | Admit: 2017-03-10 | Discharge: 2017-03-10 | Disposition: A | Discharge status: Home / Self Care | Payer: 59 | Source: Ambulatory Visit | Attending: Family Medicine | Admitting: Family Medicine

## 2017-03-10 ENCOUNTER — Encounter: Payer: Self-pay | Admitting: Family Medicine

## 2017-03-10 VITALS — BP 96/56 | HR 74 | Temp 98.8°F | Wt 124.8 lb

## 2017-03-10 DIAGNOSIS — R059 Cough, unspecified: Secondary | ICD-10-CM

## 2017-03-10 DIAGNOSIS — R05 Cough: Secondary | ICD-10-CM

## 2017-03-10 MED ORDER — BENZONATATE 200 MG PO CAPS: 200.0000 mg | ORAL_CAPSULE | Freq: Three times a day (TID) | ORAL | 1 refills | Status: DC | PRN

## 2017-03-10 MED ORDER — DOXYCYCLINE HYCLATE 100 MG PO TABS: 100.0000 mg | ORAL_TABLET | Freq: Two times a day (BID) | ORAL | 0 refills | Status: DC

## 2017-03-10 NOTE — Patient Instructions (Signed)
Likely bronchitis.  Start doxycycline.  Use tessalon for the cough.  Rest and fluids.   Go to the lab on the way out.  We'll contact you with your xray report. Take care.  Glad to see you.

## 2017-03-10 NOTE — Progress Notes (Signed)
Sx started about 3 weeks ago.  She isn't lightheaded.  She has lower/normal BP at baseline.   She was in [place] for about 2 weeks, has been back for about 10 days.  Cough with sputum.  Chest is congested.  Taking mucinex.  No fevers.  Some occ extra noise noted in the chest with cough.  Some sputum, greenish.  Coughing to the point of vomiting.  No fevers.  Some fatigue.    She had a rash that improved with zyrtec.  She has derm f/u pending.    Meds, vitals, and allergies reviewed.   ROS: Per HPI unless specifically indicated in ROS section   GEN: nad, alert and oriented HEENT: mucous membranes moist, tm w/o erythema, nasal exam w/o erythema, scant clear discharge noted,  OP with cobblestoning NECK: supple w/o LA CV: rrr.   PULM:  ctab with cough, no wheeze, no inc wob, no focal dec in BS EXT: no edema

## 2017-03-11 DIAGNOSIS — R05 Cough: Secondary | ICD-10-CM | POA: Insufficient documentation

## 2017-03-11 DIAGNOSIS — R059 Cough, unspecified: Secondary | ICD-10-CM | POA: Insufficient documentation

## 2017-03-11 NOTE — Assessment & Plan Note (Addendum)
Nontoxic, given the duration and travel would check CXR.  See notes on imaging.  Start doxy in the meantime.  She agrees.  Tessalon for cough.  Rest and fluids.  Not SOB. See avs.

## 2017-06-18 ENCOUNTER — Other Ambulatory Visit: Payer: Self-pay | Admitting: Family Medicine

## 2017-06-19 ENCOUNTER — Encounter: Payer: Self-pay | Admitting: *Deleted

## 2017-06-20 ENCOUNTER — Encounter: Payer: Self-pay | Admitting: Family Medicine

## 2017-06-20 ENCOUNTER — Other Ambulatory Visit: Payer: Self-pay

## 2017-06-20 ENCOUNTER — Ambulatory Visit (INDEPENDENT_AMBULATORY_CARE_PROVIDER_SITE_OTHER): Payer: 59 | Admitting: Family Medicine

## 2017-06-20 VITALS — BP 90/60 | HR 70 | Temp 98.3°F | Ht 60.5 in | Wt 125.5 lb

## 2017-06-20 DIAGNOSIS — N644 Mastodynia: Secondary | ICD-10-CM | POA: Diagnosis not present

## 2017-06-20 NOTE — Progress Notes (Signed)
   Subjective:    Patient ID: Jennifer Mendez, female    DOB: 1965-11-17, 52 y.o.   MRN: [id number]  HPI   52 year old female presents with new onset left breast pain x 4-5 days.  Tyelnol helped.  No known injury. No rash.  no lumps.  Last mammogram 2016 nml. She has mirena IUD  Menses rare.   family hx: no  Breast cancer No new meds except allegra for allergies x 2 months.  minimal caffeine.  Blood pressure 90/60, pulse 70, temperature 98.3 F (36.8 C), temperature source Oral, height 5' 0.5" (1.537 m), weight 125 lb 8 oz (56.9 kg).   Review of Systems  Constitutional: Negative for fatigue and fever.  HENT: Negative for congestion.   Eyes: Negative for pain.  Respiratory: Negative for cough and shortness of breath.   Cardiovascular: Negative for chest pain, palpitations and leg swelling.  Gastrointestinal: Negative for abdominal pain.  Genitourinary: Negative for dysuria and vaginal bleeding.  Musculoskeletal: Negative for back pain.  Neurological: Negative for syncope, light-headedness and headaches.  Psychiatric/Behavioral: Negative for dysphoric mood.       Objective:   Physical Exam  Constitutional: Vital signs are normal. She appears well-developed and well-nourished. She is cooperative.  Non-toxic appearance. She does not appear ill. No distress.  HENT:  Head: Normocephalic.  Right Ear: Hearing, tympanic membrane, external ear and ear canal normal.  Left Ear: Hearing, tympanic membrane, external ear and ear canal normal.  Nose: Nose normal.  Eyes: Pupils are equal, round, and reactive to light. Conjunctivae, EOM and lids are normal. Lids are everted and swept, no foreign bodies found.  Neck: Trachea normal and normal range of motion. Neck supple. Carotid bruit is not present. No thyroid mass and no thyromegaly present.  Cardiovascular: Normal rate, regular rhythm, S1 normal, S2 normal, normal heart sounds and intact distal pulses. Exam reveals no gallop.  No  murmur heard. Pulmonary/Chest: Effort normal and breath sounds normal. No respiratory distress. She has no wheezes. She has no rhonchi. She has no rales. Right breast exhibits no inverted nipple, no mass, no nipple discharge, no skin change and no tenderness. Left breast exhibits tenderness. Left breast exhibits no inverted nipple, no mass, no nipple discharge and no skin change. No breast discharge or bleeding. Breasts are symmetrical.    Abdominal: Soft. Normal appearance and bowel sounds are normal. She exhibits no distension, no fluid wave, no abdominal bruit and no mass. There is no hepatosplenomegaly. There is no tenderness. There is no rebound, no guarding and no CVA tenderness. No hernia.  Genitourinary: No breast tenderness, discharge or bleeding. Pelvic exam was performed with patient supine. There is no lesion on the left labia.  Lymphadenopathy:    She has no cervical adenopathy.    She has no axillary adenopathy.  Neurological: She is alert. She has normal strength. No cranial nerve deficit or sensory deficit.  Skin: Skin is warm, dry and intact. No rash noted.  Psychiatric: Her speech is normal and behavior is normal. Judgment normal. Her mood appears not anxious. Cognition and memory are normal. She does not exhibit a depressed mood.          Assessment & Plan:

## 2017-06-20 NOTE — Assessment & Plan Note (Signed)
No mass palpated, refer for mammogram and [place].

## 2017-07-11 ENCOUNTER — Ambulatory Visit
Admission: RE | Admit: 2017-07-11 | Discharge: 2017-07-11 | Disposition: A | Discharge status: Home / Self Care | Payer: 59 | Source: Ambulatory Visit | Attending: Family Medicine | Admitting: Family Medicine

## 2017-07-11 ENCOUNTER — Other Ambulatory Visit: Payer: Self-pay | Admitting: Family Medicine

## 2017-07-11 DIAGNOSIS — Z1231 Encounter for screening mammogram for malignant neoplasm of breast: Secondary | ICD-10-CM | POA: Diagnosis present

## 2017-07-11 DIAGNOSIS — N644 Mastodynia: Secondary | ICD-10-CM

## 2017-10-24 ENCOUNTER — Encounter: Payer: Self-pay | Admitting: Family Medicine

## 2017-10-24 ENCOUNTER — Ambulatory Visit (INDEPENDENT_AMBULATORY_CARE_PROVIDER_SITE_OTHER): Payer: 59 | Admitting: Family Medicine

## 2017-10-24 VITALS — BP 100/78 | HR 70 | Temp 98.1°F | Ht 61.0 in | Wt 127.0 lb

## 2017-10-24 DIAGNOSIS — L282 Other prurigo: Secondary | ICD-10-CM | POA: Diagnosis not present

## 2017-10-24 DIAGNOSIS — S70362A Insect bite (nonvenomous), left thigh, initial encounter: Secondary | ICD-10-CM | POA: Diagnosis not present

## 2017-10-24 DIAGNOSIS — L089 Local infection of the skin and subcutaneous tissue, unspecified: Secondary | ICD-10-CM | POA: Diagnosis not present

## 2017-10-24 DIAGNOSIS — S90862A Insect bite (nonvenomous), left foot, initial encounter: Secondary | ICD-10-CM

## 2017-10-24 DIAGNOSIS — W57XXXA Bitten or stung by nonvenomous insect and other nonvenomous arthropods, initial encounter: Secondary | ICD-10-CM

## 2017-10-24 MED ORDER — TRIAMCINOLONE ACETONIDE 0.1 % EX CREA: 1.0000 "application " | TOPICAL_CREAM | Freq: Two times a day (BID) | CUTANEOUS | 0 refills | Status: DC

## 2017-10-24 MED ORDER — CEPHALEXIN 500 MG PO CAPS: 500.0000 mg | ORAL_CAPSULE | Freq: Three times a day (TID) | ORAL | 0 refills | Status: DC

## 2017-10-24 NOTE — Patient Instructions (Addendum)
Stay on both the xyzal and the allegra   Keep cool and avoid things that flare your skin   For the insect bites on left leg - I want to treat you for skin infection  Take the keflex as directed  Avoid submerging areas until these heal  Keep them clean with soap and water   Use the triamcinolone cream- small amount twice daily to the bites to calm down inflammation

## 2017-10-24 NOTE — Progress Notes (Signed)
Subjective:    Patient ID: Jennifer Mendez, female    DOB: March 25, 1965, 52 y.o.   MRN: [id number]  HPI  52 yo pt of Dr Diona Browner  Here for insect bites  One on top of L foot -not healing/ still itching - it weeps at times and a little painful /irritated Hurts to wear shoes and socks  L thigh above the knee -not as bad as the other one   For over a month   Just got back from [place] -but bites pre dated that trip   Had tick bites in the past and then diagnosed with chronic papular urticaria  Saw Dr at [facility] in the past - was given drug to reduce immune function  Has had skin reactions ever since -rashes on and off a lot  She thinks she has never "healed well" since then   Has taken allegra Also xyzal   She did have allergy testing -did not come up with much   Patient Active Problem List   Diagnosis Date Noted  . Multiple insect bites 10/24/2017  . Local skin infection 10/24/2017  . Breast pain, left 06/20/2017  . Cough 03/11/2017  . Insomnia 03/31/2015  . Vaginal discharge 03/31/2015  . Chronic papular urticaria 06/24/2014  . Anxiety 05/22/2011  . Hyperlipidemia 09/02/2006   Past Medical History:  Diagnosis Date  . Blepharospasm   . Dysmenorrhea   . Female pelvic pain   . IUD   . Leukorrhea, not specified as infective   . Other and unspecified hyperlipidemia   . Other malaise and fatigue   . Periapical abscess without sinus   . Spasm of muscle   . Unspecified constipation    Past Surgical History:  Procedure Laterality Date  . LASIX  1998   Bilateral  . Pelvic X-Ray  07/2004   IUD in place   Social History   Tobacco Use  . Smoking status: Never Smoker  . Smokeless tobacco: Never Used  Substance Use Topics  . Alcohol use: Yes    Comment: Occasional glass of wine  . Drug use: No   Family History  Problem Relation Age of Onset  . Tuberculosis Father        ?  Marland Kitchen Tuberculosis Mother   . Breast cancer Neg Hx    No Known Allergies Current  Outpatient Medications on File Prior to Visit  Medication Sig Dispense Refill  . fexofenadine (ALLEGRA) 180 MG tablet Take two tablets twice daily    . levocetirizine (XYZAL) 5 MG tablet   11  . levonorgestrel (MIRENA) 20 MCG/24HR IUD 1 each by Intrauterine route once.      . Multiple Vitamin (MULTIVITAMIN) tablet Take 1 tablet by mouth daily.       No current facility-administered medications on file prior to visit.      Review of Systems  Constitutional: Negative for activity change, appetite change, fatigue, fever and unexpected weight change.  HENT: Negative for congestion, ear pain, rhinorrhea, sinus pressure and sore throat.   Eyes: Negative for pain, redness and visual disturbance.  Respiratory: Negative for cough, shortness of breath and wheezing.   Cardiovascular: Negative for chest pain and palpitations.  Gastrointestinal: Negative for abdominal pain, blood in stool, constipation and diarrhea.  Endocrine: Negative for polydipsia and polyuria.  Genitourinary: Negative for dysuria, frequency and urgency.  Musculoskeletal: Negative for arthralgias, back pain and myalgias.  Skin: Positive for rash and wound. Negative for pallor.  Allergic/Immunologic: Negative for environmental allergies.  Neurological:  Negative for dizziness, syncope and headaches.  Hematological: Negative for adenopathy. Does not bruise/bleed easily.  Psychiatric/Behavioral: Negative for decreased concentration and dysphoric mood. The patient is not nervous/anxious.        Objective:   Physical Exam  Constitutional: She appears well-developed and well-nourished. No distress.  Well appearing   HENT:  Head: Normocephalic and atraumatic.  Mouth/Throat: Oropharynx is clear and moist.  Eyes: Pupils are equal, round, and reactive to light. Conjunctivae and EOM are normal.  Neck: Normal range of motion. Neck supple.  Cardiovascular: Normal rate, regular rhythm and normal heart sounds.  Pulmonary/Chest: Effort  normal and breath sounds normal. She has no wheezes.  Lymphadenopathy:    She has no cervical adenopathy.  Neurological: She is alert. No cranial nerve deficit.  Skin: Skin is warm and dry. Rash noted.  Area of induration and erythema on L foot (top) 1 cm with central golden crust (not actively draining)   2 spots above knee on L leg - 0.5 cm and 0.25 cm - similar to above but no crust or scab formation   Small areas of urticaria on waist  Tiny papules on upper arms   Some tiny vesicles on palms resembling  eczema   Psychiatric: She has a normal mood and affect.          Assessment & Plan:   Problem List Items Addressed This Visit      Musculoskeletal and Integument   Chronic papular urticaria    Recommended she continue the allegra and xyzal both (when she stops one of them symptoms worsen)  The allegra was doses previously by a specialist at a very high dose of 2 pills bid  Recommend just one daily at this point       Local skin infection - Primary    L leg and foot- appear to be insect bites that are not healing - with some signs of infection  Px keflex orally 500 tid Triamcinolone cream bid for itch  Keep clean with soap and water several times daily  Watch for inc redness/ soreness/swelling or drainage  Update if not starting to improve in a week or if worsening        Relevant Medications   cephALEXin (KEFLEX) 500 MG capsule     Other   Multiple insect bites    On L leg and thigh- suspect local skin infection Will tx with keflex

## 2017-10-26 NOTE — Assessment & Plan Note (Signed)
On L leg and thigh- suspect local skin infection Will tx with keflex

## 2017-10-26 NOTE — Assessment & Plan Note (Signed)
L leg and foot- appear to be insect bites that are not healing - with some signs of infection  Px keflex orally 500 tid Triamcinolone cream bid for itch  Keep clean with soap and water several times daily  Watch for inc redness/ soreness/swelling or drainage  Update if not starting to improve in a week or if worsening

## 2017-10-26 NOTE — Assessment & Plan Note (Signed)
Recommended she continue the allegra and xyzal both (when she stops one of them symptoms worsen)  The allegra was doses previously by a specialist at a very high dose of 2 pills bid  Recommend just one daily at this point

## 2018-01-16 ENCOUNTER — Telehealth: Payer: Self-pay | Admitting: Family Medicine

## 2018-01-16 NOTE — Telephone Encounter (Signed)
Pt called office to schedule a physical. The next available day is not until 04/07/18. Pt does not want to wait and wanted to see if she could get in before January. Pt explained she will have different insurance. Please advise if we can get her in sooner.

## 2018-01-16 NOTE — Telephone Encounter (Signed)
Okay to schedule her earlier in 30 min slot or 2 x 15 min slots

## 2018-01-19 NOTE — Telephone Encounter (Signed)
Labs 12/18 cpx 12/20 Pt aware

## 2018-01-20 ENCOUNTER — Encounter: Payer: Self-pay | Admitting: Obstetrics & Gynecology

## 2018-01-20 ENCOUNTER — Ambulatory Visit (INDEPENDENT_AMBULATORY_CARE_PROVIDER_SITE_OTHER): Payer: 59 | Admitting: Obstetrics & Gynecology

## 2018-01-20 VITALS — BP 86/57 | HR 72 | Wt 130.2 lb

## 2018-01-20 DIAGNOSIS — Z124 Encounter for screening for malignant neoplasm of cervix: Secondary | ICD-10-CM | POA: Diagnosis not present

## 2018-01-20 DIAGNOSIS — Z1151 Encounter for screening for human papillomavirus (HPV): Secondary | ICD-10-CM | POA: Diagnosis not present

## 2018-01-20 DIAGNOSIS — Z01419 Encounter for gynecological examination (general) (routine) without abnormal findings: Secondary | ICD-10-CM

## 2018-01-20 NOTE — Progress Notes (Signed)
Subjective:    Jennifer Mendez is a 53 y.o. married Chinese P1 (5 yo son) female who presents for an annual exam. The patient has no complaints today. The patient is sexually active. GYN screening history: last pap: was normal. The patient wears seatbelts: yes. The patient participates in regular exercise: yes. (treadmill)  Has the patient ever been transfused or tattooed?: no. The patient reports that there is not domestic violence in her life.   Menstrual History: OB History    Gravida  3   Para  1   Term      Preterm      AB      Living  1     SAB      TAB      Ectopic      Multiple      Live Births               No LMP recorded. (Menstrual status: IUD).    The following portions of the patient's history were reviewed and updated as appropriate: allergies, current medications, past family history, past medical history, past social history, past surgical history and problem list.  Review of Systems Pertinent items are noted in HPI.   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 Mammogram due FH- no breast/gyn/colon cancer  Objective:    Heart- RRR Lungs- CTAB Abd- benign Vulva Vagina Cervix bimanual   Assessment:    Healthy female exam. with cotesting Fasting labs tomorrow   Plan:     Thin prep Pap smear.

## 2018-01-20 NOTE — Progress Notes (Signed)
LAST PAP- 09/16/2014 Needs Mammogram

## 2018-01-21 ENCOUNTER — Other Ambulatory Visit: Payer: Self-pay | Admitting: Family Medicine

## 2018-01-21 ENCOUNTER — Other Ambulatory Visit (INDEPENDENT_AMBULATORY_CARE_PROVIDER_SITE_OTHER): Payer: 59

## 2018-01-21 DIAGNOSIS — E7800 Pure hypercholesterolemia, unspecified: Secondary | ICD-10-CM

## 2018-01-21 LAB — COMPREHENSIVE METABOLIC PANEL
ALT: 26 U/L (ref 0–35)
AST: 21 U/L (ref 0–37)
Albumin: 4.4 g/dL (ref 3.5–5.2)
Alkaline Phosphatase: 73 U/L (ref 39–117)
BUN: 20 mg/dL (ref 6–23)
CHLORIDE: 104 meq/L (ref 96–112)
CO2: 29 meq/L (ref 19–32)
Calcium: 9.4 mg/dL (ref 8.4–10.5)
Creatinine, Ser: 0.69 mg/dL (ref 0.40–1.20)
GFR: 95.69 mL/min (ref >60.00)
GLUCOSE: 92 mg/dL (ref 70–99)
Potassium: 3.8 mEq/L (ref 3.5–5.1)
SODIUM: 141 meq/L (ref 135–145)
TOTAL PROTEIN: 7.4 g/dL (ref 6.0–8.3)
Total Bilirubin: 0.4 mg/dL (ref 0.2–1.2)

## 2018-01-21 LAB — LIPID PANEL
CHOL/HDL RATIO: 5
Cholesterol: 215 mg/dL — ABNORMAL HIGH (ref 0–200)
HDL: 46.7 mg/dL (ref >39.00)
LDL CALC: 133 mg/dL — AB (ref 0–99)
NONHDL: 168.25
Triglycerides: 178 mg/dL — ABNORMAL HIGH (ref 0.0–149.0)
VLDL: 35.6 mg/dL (ref 0.0–40.0)

## 2018-01-23 ENCOUNTER — Ambulatory Visit (INDEPENDENT_AMBULATORY_CARE_PROVIDER_SITE_OTHER): Payer: 59 | Admitting: Family Medicine

## 2018-01-23 ENCOUNTER — Encounter: Payer: Self-pay | Admitting: Family Medicine

## 2018-01-23 VITALS — BP 118/70 | HR 84 | Temp 98.4°F | Ht 61.0 in | Wt 130.0 lb

## 2018-01-23 DIAGNOSIS — Z0000 Encounter for general adult medical examination without abnormal findings: Secondary | ICD-10-CM

## 2018-01-23 DIAGNOSIS — Z1211 Encounter for screening for malignant neoplasm of colon: Secondary | ICD-10-CM | POA: Diagnosis not present

## 2018-01-23 DIAGNOSIS — L282 Other prurigo: Secondary | ICD-10-CM

## 2018-01-23 DIAGNOSIS — E7800 Pure hypercholesterolemia, unspecified: Secondary | ICD-10-CM

## 2018-01-23 NOTE — Patient Instructions (Addendum)
Please stop at the front desk to set up referral.  Work on low cholesterol low fat diet. Keep up exercise.

## 2018-01-23 NOTE — Assessment & Plan Note (Signed)
AHA 10 year risk cal: 1.3 % risk Encouraged exercise, weight loss, healthy eating habits.

## 2018-01-23 NOTE — Assessment & Plan Note (Signed)
Improved with Xyzal.

## 2018-01-23 NOTE — Progress Notes (Signed)
Subjective:    Patient ID: Jennifer Mendez, female    DOB: 26-Apr-1965, 52 y.o.   MRN: [id number]  HPI The patient is here for annual wellness exam and preventative care.    Elevated Cholesterol:  AHA 10 year risk cal: 1.3 % risk  Lab Results  Component Value Date   CHOL 215 (H) 01/21/2018   HDL 46.70 01/21/2018   LDLCALC 133 (H) 01/21/2018   LDLDIRECT 161.0 05/16/2011   TRIG 178.0 (H) 01/21/2018   CHOLHDL 5 01/21/2018  Using medications without problems: Muscle aches:  Diet compliance: moderate.. still eats pork belly Exercise:  Daily 30 min. Other complaints:    Office Visit from 01/23/2018 in [place] at [facility] Total Score  0     Work had been stressful but she is moving to a new job.  Social History /Family History/Past Medical History reviewed in detail and updated in EMR if needed. Blood pressure 118/70, pulse 84, temperature 98.4 F (36.9 C), temperature source Oral, height 5\' 1"  (1.549 m), weight 130 lb (59 kg), SpO2 97 %.  Wt Readings from Last 3 Encounters:  01/23/18 130 lb (59 kg)  01/20/18 130 lb 3.2 oz (59.1 kg)  10/24/17 127 lb (57.6 kg)   Body mass index is 24.56 kg/m.   Review of Systems  Constitutional: Negative for fatigue and fever.  HENT: Negative for congestion.   Eyes: Negative for pain.  Respiratory: Negative for cough and shortness of breath.   Cardiovascular: Negative for chest pain, palpitations and leg swelling.  Gastrointestinal: Negative for abdominal pain.  Genitourinary: Negative for dysuria and vaginal bleeding.  Musculoskeletal: Negative for back pain.  Neurological: Negative for syncope, light-headedness and headaches.  Psychiatric/Behavioral: Negative for dysphoric mood.       Objective:   Physical Exam Constitutional:      General: She is not in acute distress.    Appearance: Normal appearance. She is well-developed. She is not ill-appearing or toxic-appearing.  HENT:     Head: Normocephalic.    Right Ear: Hearing, tympanic membrane, ear canal and external ear normal.     Left Ear: Hearing, tympanic membrane, ear canal and external ear normal.     Nose: Nose normal.  Eyes:     General: Lids are normal. Lids are everted, no foreign bodies appreciated.     Conjunctiva/sclera: Conjunctivae normal.     Pupils: Pupils are equal, round, and reactive to light.  Neck:     Musculoskeletal: Normal range of motion and neck supple.     Thyroid: No thyroid mass or thyromegaly.     Vascular: No carotid bruit.     Trachea: Trachea normal.  Cardiovascular:     Rate and Rhythm: Normal rate and regular rhythm.     Heart sounds: Normal heart sounds, S1 normal and S2 normal. No murmur. No gallop.   Pulmonary:     Effort: Pulmonary effort is normal. No respiratory distress.     Breath sounds: Normal breath sounds. No wheezing, rhonchi or rales.  Abdominal:     General: Bowel sounds are normal. There is no distension or abdominal bruit.     Palpations: Abdomen is soft. There is no fluid wave or mass.     Tenderness: There is no abdominal tenderness. There is no guarding or rebound.     Hernia: No hernia is present.  Lymphadenopathy:     Cervical: No cervical adenopathy.  Skin:    General: Skin is warm and dry.  Findings: No rash.  Neurological:     Mental Status: She is alert.     Cranial Nerves: No cranial nerve deficit.     Sensory: No sensory deficit.  Psychiatric:        Mood and Affect: Mood is not anxious or depressed.        Speech: Speech normal.        Behavior: Behavior normal. Behavior is cooperative.        Judgment: Judgment normal.           Assessment & Plan:  The patient's preventative maintenance and recommended screening tests for an annual wellness exam were reviewed in full today. Brought up to date unless services declined.  Counselled on the importance of diet, exercise, and its role in overall health and mortality. The patient's FH and SH was reviewed,  including their home life, tobacco status, and drug and alcohol status.    Last GYN OV Dr. Hulan Fray reviewed 01/20/2018 Vaccines: flu vaccine, uptodate with td.  nonsmoker  Colon: no early colon cancer  Mammo and pap/DVE at GYN HIV: refused.  Colon:

## 2018-01-23 NOTE — Assessment & Plan Note (Signed)
Low CVD risk Encouraged exercise, weight loss, healthy eating habits.

## 2018-01-26 LAB — CYTOLOGY - PAP
DIAGNOSIS: NEGATIVE
HPV (WINDOPATH): NOT DETECTED

## 2018-02-05 ENCOUNTER — Other Ambulatory Visit: Payer: Self-pay

## 2018-02-05 DIAGNOSIS — Z1211 Encounter for screening for malignant neoplasm of colon: Secondary | ICD-10-CM

## 2018-02-05 MED ORDER — PEG 3350-KCL-NA BICARB-NACL 420 G PO SOLR: 4000.0000 mL | Freq: Once | ORAL | 0 refills | Status: AC

## 2018-02-06 ENCOUNTER — Encounter: Payer: Self-pay | Admitting: *Deleted

## 2018-02-09 ENCOUNTER — Ambulatory Visit
Admission: RE | Admit: 2018-02-09 | Discharge: 2018-02-09 | Disposition: A | Discharge status: Home / Self Care | Payer: 59 | Attending: Gastroenterology | Admitting: Gastroenterology

## 2018-02-09 ENCOUNTER — Ambulatory Visit: Payer: 59 | Admitting: Anesthesiology

## 2018-02-09 ENCOUNTER — Encounter
Admission: RE | Disposition: A | Discharge status: Home / Self Care | Payer: Self-pay | Source: Home / Self Care | Attending: Gastroenterology

## 2018-02-09 ENCOUNTER — Encounter: Payer: Self-pay | Admitting: Anesthesiology

## 2018-02-09 DIAGNOSIS — G709 Myoneural disorder, unspecified: Secondary | ICD-10-CM | POA: Diagnosis not present

## 2018-02-09 DIAGNOSIS — Z1211 Encounter for screening for malignant neoplasm of colon: Secondary | ICD-10-CM

## 2018-02-09 DIAGNOSIS — E785 Hyperlipidemia, unspecified: Secondary | ICD-10-CM | POA: Insufficient documentation

## 2018-02-09 DIAGNOSIS — Z79899 Other long term (current) drug therapy: Secondary | ICD-10-CM | POA: Insufficient documentation

## 2018-02-09 DIAGNOSIS — Z793 Long term (current) use of hormonal contraceptives: Secondary | ICD-10-CM | POA: Diagnosis not present

## 2018-02-09 DIAGNOSIS — K644 Residual hemorrhoidal skin tags: Secondary | ICD-10-CM | POA: Diagnosis not present

## 2018-02-09 DIAGNOSIS — Z836 Family history of other diseases of the respiratory system: Secondary | ICD-10-CM | POA: Insufficient documentation

## 2018-02-09 HISTORY — PX: COLONOSCOPY WITH PROPOFOL: SHX5780

## 2018-02-09 LAB — POCT PREGNANCY, URINE: Preg Test, Ur: NEGATIVE

## 2018-02-09 SURGERY — COLONOSCOPY WITH PROPOFOL
Anesthesia: General

## 2018-02-09 MED ORDER — SODIUM CHLORIDE 0.9 % IV SOLN
INTRAVENOUS | Status: DC
  Administered 2018-02-09: 1000 mL via INTRAVENOUS

## 2018-02-09 MED ORDER — PROPOFOL 500 MG/50ML IV EMUL
INTRAVENOUS | Status: DC | PRN
  Administered 2018-02-09: 175 ug/kg/min via INTRAVENOUS

## 2018-02-09 MED ORDER — PROPOFOL 10 MG/ML IV BOLUS
INTRAVENOUS | Status: DC | PRN
  Administered 2018-02-09: 70 mg via INTRAVENOUS

## 2018-02-09 MED ORDER — PROPOFOL 500 MG/50ML IV EMUL
INTRAVENOUS | Status: AC
  Filled 2018-02-09: qty 50

## 2018-02-09 MED ORDER — LIDOCAINE HCL (PF) 2 % IJ SOLN
INTRAMUSCULAR | Status: AC
  Filled 2018-02-09: qty 10

## 2018-02-09 MED ORDER — PHENYLEPHRINE HCL 10 MG/ML IJ SOLN
INTRAMUSCULAR | Status: DC | PRN
  Administered 2018-02-09: 100 ug via INTRAVENOUS
  Administered 2018-02-09: 200 ug via INTRAVENOUS
  Administered 2018-02-09: 100 ug via INTRAVENOUS

## 2018-02-09 MED ORDER — LIDOCAINE HCL (CARDIAC) PF 100 MG/5ML IV SOSY
PREFILLED_SYRINGE | INTRAVENOUS | Status: DC | PRN
  Administered 2018-02-09: 50 mg via INTRAVENOUS

## 2018-02-09 NOTE — Transfer of Care (Signed)
Immediate Anesthesia Transfer of Care Note  Patient: Jennifer Mendez  Procedure(s) Performed: COLONOSCOPY WITH PROPOFOL (N/A )  Patient Location: PACU  Anesthesia Type:  Level of Consciousness: sedated  Airway & Oxygen Therapy: Patient Spontanous Breathing  Post-op Assessment: Report given to RN and Post -op Vital signs reviewed and stable  Post vital signs: Reviewed and stable  Last Vitals:  Vitals Value Taken Time  BP 87/66 02/09/2018 11:36 AM  Temp 36.1 C 02/09/2018 11:36 AM  Pulse 57 02/09/2018 11:38 AM  Resp 18 02/09/2018 11:38 AM  SpO2 100 % 02/09/2018 11:38 AM  Vitals shown include unvalidated device data.  Last Pain:  Vitals:   02/09/18 1136  TempSrc: Tympanic  PainSc: 0-No pain         Complications: No apparent anesthesia complications

## 2018-02-09 NOTE — Anesthesia Preprocedure Evaluation (Signed)
Anesthesia Evaluation  Patient identified by MRN, date of birth, ID band Patient awake    Reviewed: Allergy & Precautions, NPO status , Patient's Chart, lab work & pertinent test results, reviewed documented beta blocker date and time   Airway Mallampati: II  TM Distance: >3 FB     Dental  (+) Chipped   Pulmonary           Cardiovascular      Neuro/Psych  Neuromuscular disease    GI/Hepatic   Endo/Other    Renal/GU      Musculoskeletal   Abdominal   Peds  Hematology   Anesthesia Other Findings   Reproductive/Obstetrics                             Anesthesia Physical Anesthesia Plan  ASA: II  Anesthesia Plan: General   Post-op Pain Management:    Induction: Intravenous  PONV Risk Score and Plan:   Airway Management Planned:   Additional Equipment:   Intra-op Plan:   Post-operative Plan:   Informed Consent: I have reviewed the patients History and Physical, chart, labs and discussed the procedure including the risks, benefits and alternatives for the proposed anesthesia with the patient or authorized representative who has indicated his/her understanding and acceptance.     Plan Discussed with: CRNA  Anesthesia Plan Comments:         Anesthesia Quick Evaluation  

## 2018-02-09 NOTE — Anesthesia Postprocedure Evaluation (Signed)
Anesthesia Post Note  Patient: Jennifer Mendez  Procedure(s) Performed: COLONOSCOPY WITH PROPOFOL (N/A )  Patient location during evaluation: Endoscopy Anesthesia Type: General Level of consciousness: awake and alert Pain management: pain level controlled Vital Signs Assessment: post-procedure vital signs reviewed and stable Respiratory status: spontaneous breathing, nonlabored ventilation, respiratory function stable and patient connected to nasal cannula oxygen Cardiovascular status: blood pressure returned to baseline and stable Postop Assessment: no apparent nausea or vomiting Anesthetic complications: no     Last Vitals:  Vitals:   02/09/18 1156 02/09/18 1206  BP: (!) 88/68 102/65  Pulse: 78 68  Resp: (!) 21 20  Temp:    SpO2: 100% 99%    Last Pain:  Vitals:   02/09/18 1206  TempSrc:   PainSc: 0-No pain                 Rease Wence S

## 2018-02-09 NOTE — Op Note (Signed)
[facility] Gastroenterology Patient Name: Jennifer Mendez Procedure Date: 02/09/2018 11:10 AM MRN: [id number] Account #: [id number] Date of Birth: March 04, 1965 Admit Type: Outpatient Age: 53 Room: [facility] ENDO ROOM 2 Gender: Female Note Status: Finalized Procedure:            Colonoscopy Indications:          Screening for colorectal malignant neoplasm, This is                        the patient's first colonoscopy Providers:            Lin Landsman MD, MD Referring MD:         Jinny Sanders MD, MD (Referring MD) Medicines:            Monitored Anesthesia Care Complications:        No immediate complications. Estimated blood loss: None. Procedure:            Pre-Anesthesia Assessment:                       - Prior to the procedure, a History and Physical was                        performed, and patient medications and allergies were                        reviewed. The patient is competent. The risks and                        benefits of the procedure and the sedation options and                        risks were discussed with the patient. All questions                        were answered and informed consent was obtained.                        Patient identification and proposed procedure were                        verified by the physician, the nurse, the                        anesthesiologist, the anesthetist and the technician in                        the pre-procedure area in the procedure room in the                        endoscopy suite. Mental Status Examination: alert and                        oriented. Airway Examination: normal oropharyngeal                        airway and neck mobility. Respiratory Examination:                        clear to auscultation. CV Examination: normal.  Prophylactic Antibiotics: The patient does not require                        prophylactic antibiotics. Prior Anticoagulants: The                  patient has taken no previous anticoagulant or                        antiplatelet agents. ASA Grade Assessment: II - A                        patient with mild systemic disease. After reviewing the                        risks and benefits, the patient was deemed in                        satisfactory condition to undergo the procedure. The                        anesthesia plan was to use monitored anesthesia care                        (MAC). Immediately prior to administration of                        medications, the patient was re-assessed for adequacy                        to receive sedatives. The heart rate, respiratory rate,                        oxygen saturations, blood pressure, adequacy of                        pulmonary ventilation, and response to care were                        monitored throughout the procedure. The physical status                        of the patient was re-assessed after the procedure.                       After obtaining informed consent, the colonoscope was                        passed under direct vision. Throughout the procedure,                        the patient's blood pressure, pulse, and oxygen                        saturations were monitored continuously. The was                        introduced through the anus and advanced to the the                        cecum, identified by appendiceal  orifice and ileocecal                        valve. The colonoscopy was performed without                        difficulty. The patient tolerated the procedure well.                        The quality of the bowel preparation was evaluated                        using the BBPS [facility] Bowel Preparation Scale) with                        scores of: Right Colon = 3, Transverse Colon = 3 and                        Left Colon = 3 (entire mucosa seen well with no                        residual staining, small fragments of stool or opaque                         liquid). The total BBPS score equals 9. Findings:      Skin tags were found on perianal exam.      The entire examined colon appeared normal.      The retroflexed view of the distal rectum and anal verge was normal and       showed no anal or rectal abnormalities. Impression:           - Perianal skin tags found on perianal exam.                       - The entire examined colon is normal.                       - The distal rectum and anal verge are normal on                        retroflexion view.                       - No specimens collected. Recommendation:       - Discharge patient to home (with escort).                       - Resume regular diet today.                       - Continue present medications.                       - Repeat colonoscopy in 10 years for surveillance. Procedure Code(s):    --- Professional ---                       K9326, Colorectal cancer screening; colonoscopy on                        individual not meeting criteria for high  risk Diagnosis Code(s):    --- Professional ---                       Z12.11, Encounter for screening for malignant neoplasm                        of colon                       K64.4, Residual hemorrhoidal skin tags CPT copyright 2018 American Medical Association. All rights reserved. The codes documented in this report are preliminary and upon coder review may  be revised to meet current compliance requirements. Dr. Ulyess Mort Lin Landsman MD, MD 02/09/2018 11:30:42 AM This report has been signed electronically. Number of Addenda: 0 Note Initiated On: 02/09/2018 11:10 AM Scope Withdrawal Time: 0 hours 8 minutes 50 seconds  Total Procedure Duration: 0 hours 12 minutes 50 seconds       [facility]

## 2018-02-09 NOTE — H&P (Signed)
Cephas Darby, MD [address]  [place], [place] [address]  Main: [phone]  Fax: [phone] Pager: [phone]  Primary Care Physician:  Jinny Sanders, MD Primary Gastroenterologist:  Dr. Cephas Darby  Pre-Procedure History & Physical: HPI:  Jennifer Mendez is a 53 y.o. female is here for an colonoscopy.   Past Medical History:  Diagnosis Date  . Blepharospasm   . Dysmenorrhea   . Female pelvic pain   . IUD   . Leukorrhea, not specified as infective   . Other and unspecified hyperlipidemia   . Other malaise and fatigue   . Periapical abscess without sinus   . Spasm of muscle   . Unspecified constipation     Past Surgical History:  Procedure Laterality Date  . LASIX  1998   Bilateral  . Pelvic X-Ray  07/2004   IUD in place    Prior to Admission medications   Medication Sig Start Date End Date Taking? Authorizing Provider  levocetirizine (XYZAL) 5 MG tablet Take 10 mg by mouth.  05/14/17  Yes [provider]  levonorgestrel (MIRENA) 20 MCG/24HR IUD 1 each by Intrauterine route once.     Yes [provider]  Multiple Vitamin (MULTIVITAMIN) tablet Take 1 tablet by mouth daily.     Yes [provider]  triamcinolone cream (KENALOG) 0.1 % Apply 1 application topically 2 (two) times daily. To affected areas 10/24/17  Yes Tower, Wynelle Fanny, MD    Allergies as of 02/05/2018  . (No Known Allergies)    Family History  Problem Relation Age of Onset  . Tuberculosis Father        ?  Marland Kitchen Tuberculosis Mother   . Breast cancer Neg Hx     Social History   Socioeconomic History  . Marital status: Married    Spouse name: Not on file  . Number of children: 1  . Years of education: Not on file  . Highest education level: Not on file  Occupational History    Employer: [employer]  Social Needs  . Financial resource strain: Not on file  . Food insecurity:    Worry: Not on file    Inability: Not on file  .  Transportation needs:    Medical: Not on file    Non-medical: Not on file  Tobacco Use  . Smoking status: Never Smoker  . Smokeless tobacco: Never Used  Substance and Sexual Activity  . Alcohol use: Yes    Comment: Occasional glass of wine  . Drug use: No  . Sexual activity: Yes    Partners: Male    Birth control/protection: I.U.D.  Lifestyle  . Physical activity:    Days per week: Not on file    Minutes per session: Not on file  . Stress: Not on file  Relationships  . Social connections:    Talks on phone: Not on file    Gets together: Not on file    Attends religious service: Not on file    Active member of club or organization: Not on file    Attends meetings of clubs or organizations: Not on file    Relationship status: Not on file  . Intimate partner violence:    Fear of current or ex partner: Not on file    Emotionally abused: Not on file    Physically abused: Not on file    Forced sexual activity: Not on file  Other Topics Concern  . Not on file  Social History Narrative   Married      Moved from [place] to [place]    Review of Systems: See HPI, otherwise negative ROS  Physical Exam: BP 115/69   Pulse 76   Temp (!) 97.5 F (36.4 C) (Tympanic)   Resp 20   Ht 5\' 1"  (1.549 m)   Wt 58.1 kg   SpO2 100%   BMI 24.19 kg/m  General:   Alert,  pleasant and cooperative in NAD Head:  Normocephalic and atraumatic. Neck:  Supple; no masses or thyromegaly. Lungs:  Clear throughout to auscultation.    Heart:  Regular rate and rhythm. Abdomen:  Soft, nontender and nondistended. Normal bowel sounds, without guarding, and without rebound.   Neurologic:  Alert and  oriented x4;  grossly normal neurologically.  Impression/Plan: Jennifer Mendez is here for an colonoscopy to be performed for colon cancer screening  Risks, benefits, limitations, and alternatives regarding  colonoscopy have been reviewed with the patient.  Questions have been answered.  All parties  agreeable.   Sherri Sear, MD  02/09/2018, 10:59 AM

## 2018-02-09 NOTE — Anesthesia Post-op Follow-up Note (Signed)
Anesthesia QCDR form completed.        

## 2018-02-09 NOTE — Anesthesia Procedure Notes (Signed)
Date/Time: 02/09/2018 11:12 AM Performed by: Johnna Acosta, CRNA Pre-anesthesia Checklist: Patient identified, Emergency Drugs available, Suction available and Patient being monitored Patient Re-evaluated:Patient Re-evaluated prior to induction Oxygen Delivery Method: Nasal cannula Preoxygenation: Pre-oxygenation with 100% oxygen Induction Type: IV induction

## 2018-11-20 ENCOUNTER — Other Ambulatory Visit: Payer: Self-pay | Admitting: Family Medicine

## 2018-11-20 DIAGNOSIS — Z1231 Encounter for screening mammogram for malignant neoplasm of breast: Secondary | ICD-10-CM

## 2018-11-24 ENCOUNTER — Encounter: Payer: Self-pay | Admitting: Radiology

## 2018-12-08 ENCOUNTER — Other Ambulatory Visit: Payer: Self-pay

## 2018-12-08 DIAGNOSIS — Z20822 Contact with and (suspected) exposure to covid-19: Secondary | ICD-10-CM

## 2018-12-10 LAB — NOVEL CORONAVIRUS, NAA: SARS-CoV-2, NAA: NOT DETECTED

## 2019-01-08 ENCOUNTER — Encounter: Payer: Self-pay | Admitting: Family Medicine

## 2019-01-08 ENCOUNTER — Ambulatory Visit (INDEPENDENT_AMBULATORY_CARE_PROVIDER_SITE_OTHER): Payer: BC Managed Care – PPO | Admitting: Family Medicine

## 2019-01-08 ENCOUNTER — Other Ambulatory Visit: Payer: Self-pay

## 2019-01-08 DIAGNOSIS — K648 Other hemorrhoids: Secondary | ICD-10-CM | POA: Insufficient documentation

## 2019-01-08 MED ORDER — HYDROCORTISONE ACETATE 25 MG RE SUPP: 25.0000 mg | Freq: Two times a day (BID) | RECTAL | 0 refills | Status: DC | PRN

## 2019-01-08 NOTE — Progress Notes (Signed)
Subjective:    Patient ID: Jennifer Mendez, female    DOB: 25-Nov-1965, 53 y.o.   MRN: [id number]  This visit occurred during the SARS-CoV-2 public health emergency.  Safety protocols were in place, including screening questions prior to the visit, additional usage of staff PPE, and extensive cleaning of exam room while observing appropriate contact time as indicated for disinfecting solutions.    HPI 53 yo pt of Dr Diona Browner presents with ? Hemorrhoids   Symptoms for 2-3 months   Uncomfortable  Pain/not a lot of itching  Rectal pain -moreso on the outside  Worse when she sits down  Using prep H cream -not helping   Sometimes bleeding - if she has a hard bm   Trying to control constipation with probiotics Eating fruit like papaya  Pain at beginning of bm  bm is usually once daily  She has taken stool softeners in the past   Her last colonoscopy was 1/20  Essentially normal  Had some anal skin tags    She sits a lot for work /tries to stand when she can   Patient Active Problem List   Diagnosis Date Noted  . Hemorrhoids, internal 01/08/2019  . Encounter for screening colonoscopy   . Chronic papular urticaria 06/24/2014  . Hyperlipidemia 09/02/2006   Past Medical History:  Diagnosis Date  . Blepharospasm   . Dysmenorrhea   . Female pelvic pain   . IUD   . Leukorrhea, not specified as infective   . Other and unspecified hyperlipidemia   . Other malaise and fatigue   . Periapical abscess without sinus   . Spasm of muscle   . Unspecified constipation    Past Surgical History:  Procedure Laterality Date  . COLONOSCOPY WITH PROPOFOL N/A 02/09/2018   Procedure: COLONOSCOPY WITH PROPOFOL;  Surgeon: Lin Landsman, MD;  Location: [facility] ENDOSCOPY;  Service: Gastroenterology;  Laterality: N/A;  . LASIX  1998   Bilateral  . Pelvic X-Ray  07/2004   IUD in place   Social History   Tobacco Use  . Smoking status: Never Smoker  . Smokeless tobacco: Never Used   Substance Use Topics  . Alcohol use: Yes    Comment: Occasional glass of wine  . Drug use: No   Family History  Problem Relation Age of Onset  . Tuberculosis Father        ?  Marland Kitchen Tuberculosis Mother   . Breast cancer Neg Hx    No Known Allergies Current Outpatient Medications on File Prior to Visit  Medication Sig Dispense Refill  . levocetirizine (XYZAL) 5 MG tablet Take 10 mg by mouth.   11  . levonorgestrel (MIRENA) 20 MCG/24HR IUD 1 each by Intrauterine route once.      . Multiple Vitamin (MULTIVITAMIN) tablet Take 1 tablet by mouth daily.      Marland Kitchen triamcinolone cream (KENALOG) 0.1 % Apply 1 application topically 2 (two) times daily. To affected areas 30 g 0   No current facility-administered medications on file prior to visit.      Review of Systems  Constitutional: Negative for activity change, appetite change, fatigue, fever and unexpected weight change.  HENT: Negative for congestion, ear pain, rhinorrhea, sinus pressure and sore throat.   Eyes: Negative for pain, redness and visual disturbance.  Respiratory: Negative for cough, shortness of breath and wheezing.   Cardiovascular: Negative for chest pain and palpitations.  Gastrointestinal: Positive for anal bleeding and rectal pain. Negative for abdominal distention, abdominal pain, blood  in stool, constipation, diarrhea, nausea and vomiting.  Endocrine: Negative for polydipsia and polyuria.  Genitourinary: Negative for dysuria, frequency and urgency.  Musculoskeletal: Negative for arthralgias, back pain and myalgias.  Skin: Negative for pallor and rash.  Allergic/Immunologic: Negative for environmental allergies.  Neurological: Negative for dizziness, syncope and headaches.  Hematological: Negative for adenopathy. Does not bruise/bleed easily.  Psychiatric/Behavioral: Negative for decreased concentration and dysphoric mood. The patient is not nervous/anxious.        Objective:   Physical Exam Constitutional:       General: She is not in acute distress.    Appearance: Normal appearance. She is normal weight. She is not ill-appearing.  HENT:     Mouth/Throat:     Mouth: Mucous membranes are moist.  Eyes:     General: No scleral icterus.    Extraocular Movements: Extraocular movements intact.     Conjunctiva/sclera: Conjunctivae normal.     Pupils: Pupils are equal, round, and reactive to light.  Cardiovascular:     Rate and Rhythm: Normal rate and regular rhythm.  Pulmonary:     Effort: Pulmonary effort is normal. No respiratory distress.  Abdominal:     General: Abdomen is flat. Bowel sounds are normal. There is no distension.     Palpations: Abdomen is soft. There is no mass.     Tenderness: There is no abdominal tenderness. There is no guarding or rebound.     Hernia: No hernia is present.  Genitourinary:    Rectum: Guaiac result negative. Internal hemorrhoid present. No anal fissure. Normal anal tone.     Comments: Small internal hemorrhoid at approx 9:00 that does prolapse (seen on anoscopy) Not seemingly thrombosed Mildly tender  Small skin tag noted as well Stool is heme negative Neurological:     Mental Status: She is alert.           Assessment & Plan:   Problem List Items Addressed This Visit      Cardiovascular and Mediastinum   Hemorrhoids, internal    Small internal hemorrhoid noted at 9:00 - with mild prolapse  No bleeding currently  Px anusol hc supp  inst to avoid straining when able / keep stools soft Donut pillow prn Update if not starting to improve in a week or if worsening

## 2019-01-08 NOTE — Patient Instructions (Signed)
I think you have a hemorrhoid causing rectal pain Here is a handout to read  I sent a px for anusol hc suppository to your pharmacy  Use twice daily (rectal) as directed  Keep stools soft and avoid straining   Update [place] if no improvement in the next 10 days

## 2019-01-10 NOTE — Assessment & Plan Note (Signed)
Small internal hemorrhoid noted at 9:00 - with mild prolapse  No bleeding currently  Px anusol hc supp  inst to avoid straining when able / keep stools soft Donut pillow prn Update if not starting to improve in a week or if worsening

## 2019-02-15 ENCOUNTER — Ambulatory Visit
Admission: RE | Admit: 2019-02-15 | Discharge: 2019-02-15 | Disposition: A | Discharge status: Home / Self Care | Payer: BC Managed Care – PPO | Source: Ambulatory Visit | Attending: Family Medicine | Admitting: Family Medicine

## 2019-02-15 DIAGNOSIS — Z1231 Encounter for screening mammogram for malignant neoplasm of breast: Secondary | ICD-10-CM | POA: Insufficient documentation

## 2019-03-11 ENCOUNTER — Ambulatory Visit: Payer: BC Managed Care – PPO | Admitting: Obstetrics & Gynecology

## 2019-03-29 ENCOUNTER — Other Ambulatory Visit: Payer: Self-pay

## 2019-03-29 ENCOUNTER — Encounter: Payer: Self-pay | Admitting: Family Medicine

## 2019-03-29 ENCOUNTER — Ambulatory Visit (INDEPENDENT_AMBULATORY_CARE_PROVIDER_SITE_OTHER): Payer: BC Managed Care – PPO | Admitting: Family Medicine

## 2019-03-29 VITALS — BP 103/73 | HR 78 | Wt 130.4 lb

## 2019-03-29 DIAGNOSIS — Z01419 Encounter for gynecological examination (general) (routine) without abnormal findings: Secondary | ICD-10-CM | POA: Diagnosis not present

## 2019-03-29 DIAGNOSIS — Z1151 Encounter for screening for human papillomavirus (HPV): Secondary | ICD-10-CM

## 2019-03-29 DIAGNOSIS — Z124 Encounter for screening for malignant neoplasm of cervix: Secondary | ICD-10-CM | POA: Diagnosis not present

## 2019-03-29 NOTE — Progress Notes (Signed)
   GYNECOLOGY ANNUAL PREVENTATIVE CARE ENCOUNTER NOTE  Subjective:   Jennifer Mendez is a 54 y.o. G63P1 female here for a routine annual gynecologic exam.  Current complaints: None- feeling well. Has hemorrhoid that is frustrating- seeing PCP and gastroenterology for this.   Denies abnormal vaginal bleeding, discharge, pelvic pain, problems with intercourse or other gynecologic concerns.    Gynecologic History No LMP recorded. (Menstrual status: IUD). Contraception: IUD Last Pap: 2019. Results were: normal Last mammogram: 02/2019. Results were: normal  The following portions of the patient's history were reviewed and updated as appropriate: allergies, current medications, past family history, past medical history, past social history, past surgical history and problem list.  Review of Systems Pertinent items are noted in HPI.   Objective:  BP 103/73   Pulse 78   Wt 130 lb 6.4 oz (59.1 kg)   BMI 24.64 kg/m  CONSTITUTIONAL: Well-developed, well-nourished female in no acute distress.  HENT:  Normocephalic, atraumatic, External right and left ear normal. Oropharynx is clear and moist EYES:  No scleral icterus.  NECK: Normal range of motion, supple, no masses.  Normal thyroid.  SKIN: Skin is warm and dry. No rash noted. Not diaphoretic. No erythema. No pallor. NEUROLOGIC: Alert and oriented to person, place, and time. Normal reflexes, muscle tone coordination. No cranial nerve deficit noted. PSYCHIATRIC: Normal mood and affect. Normal behavior. Normal judgment and thought content. CARDIOVASCULAR: Normal heart rate noted, regular rhythm. 2+ distal pulses. RESPIRATORY: Effort and breath sounds normal, no problems with respiration noted. BREASTS: Symmetric in size. No masses, skin changes, nipple drainage, or lymphadenopathy. ABDOMEN: Soft,  no distention noted.  No tenderness, rebound or guarding.  PELVIC: Normal appearing external genitalia; normal appearing vaginal mucosa and cervix.  No  abnormal discharge noted. IUD strings seen at cervical os. Pap smear obtained.  Normal uterine size, no other palpable masses, no uterine or adnexal tenderness. MUSCULOSKELETAL: Normal range of motion.    Assessment and Plan:   1. Well woman exam with routine gynecological exam Up to date with other HM Reviewed wellness screenings Recommended keeping IUD for 1-2 more years and then checking FSH/LH to confirm menopause prior to removal.  Discussed role of constipation in hemorrhoids Recommended yearly lipid panel and HA1C with PCP. Discussed having CRC screening with PCP - Cytology - PAP( [place])   Please refer to After Visit Summary for other counseling recommendations.   Return in about 1 year (around 03/28/2020) for Yearly wellness exam.  Caren Macadam, MD, MPH, ABFM Attending Physician Center for [facility]

## 2019-03-29 NOTE — Progress Notes (Signed)
MM 02/25/2019-normal  Last pap 01/2018- normal  Colonoscopy?

## 2019-04-02 LAB — CYTOLOGY - PAP
Comment: NEGATIVE
Diagnosis: NEGATIVE
High risk HPV: NEGATIVE

## 2019-04-03 DIAGNOSIS — Z20828 Contact with and (suspected) exposure to other viral communicable diseases: Secondary | ICD-10-CM | POA: Diagnosis not present

## 2019-11-17 ENCOUNTER — Telehealth: Payer: Self-pay | Admitting: Family Medicine

## 2019-11-17 NOTE — Telephone Encounter (Signed)
Patient called in stating she is needing to have an international vaccination certificate, yellow card, in order to travel to [place]. Patient states they are requiring it in order for her to travel to their country. Patient stated it is a yellow card that has CDC on the bottom of it. Not aware if another government agency is needing to fill form, be authorized by them, or if from PCP is enough. Patient thought her vaccine card would be enough but they are requiring the yellow card. Please advise. Patient leaving 11/29/2019 and needed by 22nd at least.

## 2019-11-23 NOTE — Telephone Encounter (Signed)
Advised pt of [place] for Infectious Disease at [facility]. Advised this nurse could not find anywhere in the [place] to get a yellow card for the covid vaccine. She said right now she is just going to go and take her record for the vaccine from [facility] because she has also done research and she has not found where you can get a yellow card in the [place] either.  Pt appreciative for the f/u.

## 2019-11-27 DIAGNOSIS — Z03818 Encounter for observation for suspected exposure to other biological agents ruled out: Secondary | ICD-10-CM | POA: Diagnosis not present

## 2019-11-27 DIAGNOSIS — Z20822 Contact with and (suspected) exposure to covid-19: Secondary | ICD-10-CM | POA: Diagnosis not present

## 2019-11-30 DIAGNOSIS — Z20822 Contact with and (suspected) exposure to covid-19: Secondary | ICD-10-CM | POA: Diagnosis not present

## 2020-04-05 ENCOUNTER — Telehealth: Payer: Self-pay | Admitting: Family Medicine

## 2020-04-05 DIAGNOSIS — E7800 Pure hypercholesterolemia, unspecified: Secondary | ICD-10-CM

## 2020-04-05 NOTE — Telephone Encounter (Signed)
-----   Message from Cloyd Stagers, RT sent at 04/04/2020  9:21 AM EST ----- Regarding: Lab Orders for Tuesday 3.8.2022 Please place lab orders for Tuesday 3.8.2022, office visit for physical on Tuesday 3.15.2022 Thank you, Dyke Maes RT(R)

## 2020-04-11 ENCOUNTER — Other Ambulatory Visit: Payer: Self-pay

## 2020-04-11 ENCOUNTER — Other Ambulatory Visit (INDEPENDENT_AMBULATORY_CARE_PROVIDER_SITE_OTHER): Payer: BLUE CROSS/BLUE SHIELD

## 2020-04-11 DIAGNOSIS — E7800 Pure hypercholesterolemia, unspecified: Secondary | ICD-10-CM | POA: Diagnosis not present

## 2020-04-11 LAB — LIPID PANEL
Cholesterol: 279 mg/dL — ABNORMAL HIGH (ref 0–200)
HDL: 49.1 mg/dL (ref >39.00)
LDL Cholesterol: 207 mg/dL — ABNORMAL HIGH (ref 0–99)
NonHDL: 230.32
Total CHOL/HDL Ratio: 6
Triglycerides: 119 mg/dL (ref 0.0–149.0)
VLDL: 23.8 mg/dL (ref 0.0–40.0)

## 2020-04-11 LAB — COMPREHENSIVE METABOLIC PANEL
ALT: 30 U/L (ref 0–35)
AST: 22 U/L (ref 0–37)
Albumin: 4.3 g/dL (ref 3.5–5.2)
Alkaline Phosphatase: 79 U/L (ref 39–117)
BUN: 25 mg/dL — ABNORMAL HIGH (ref 6–23)
CO2: 28 mEq/L (ref 19–32)
Calcium: 9.7 mg/dL (ref 8.4–10.5)
Chloride: 103 mEq/L (ref 96–112)
Creatinine, Ser: 0.74 mg/dL (ref 0.40–1.20)
GFR: 93.1 mL/min (ref >60.00)
Glucose, Bld: 98 mg/dL (ref 70–99)
Potassium: 4.2 mEq/L (ref 3.5–5.1)
Sodium: 140 mEq/L (ref 135–145)
Total Bilirubin: 0.3 mg/dL (ref 0.2–1.2)
Total Protein: 7.6 g/dL (ref 6.0–8.3)

## 2020-04-11 NOTE — Progress Notes (Signed)
No critical labs need to be addressed urgently. We will discuss labs in detail at upcoming office visit.   

## 2020-04-18 ENCOUNTER — Ambulatory Visit (INDEPENDENT_AMBULATORY_CARE_PROVIDER_SITE_OTHER): Payer: BLUE CROSS/BLUE SHIELD | Admitting: Family Medicine

## 2020-04-18 ENCOUNTER — Encounter: Payer: Self-pay | Admitting: Family Medicine

## 2020-04-18 ENCOUNTER — Other Ambulatory Visit: Payer: Self-pay

## 2020-04-18 VITALS — BP 104/70 | HR 78 | Temp 98.2°F | Ht 60.5 in | Wt 132.8 lb

## 2020-04-18 DIAGNOSIS — K6289 Other specified diseases of anus and rectum: Secondary | ICD-10-CM

## 2020-04-18 DIAGNOSIS — E7800 Pure hypercholesterolemia, unspecified: Secondary | ICD-10-CM

## 2020-04-18 DIAGNOSIS — Z23 Encounter for immunization: Secondary | ICD-10-CM | POA: Diagnosis not present

## 2020-04-18 DIAGNOSIS — Z0001 Encounter for general adult medical examination with abnormal findings: Secondary | ICD-10-CM

## 2020-04-18 DIAGNOSIS — L989 Disorder of the skin and subcutaneous tissue, unspecified: Secondary | ICD-10-CM

## 2020-04-18 DIAGNOSIS — Z0000 Encounter for general adult medical examination without abnormal findings: Secondary | ICD-10-CM

## 2020-04-18 DIAGNOSIS — K648 Other hemorrhoids: Secondary | ICD-10-CM

## 2020-04-18 DIAGNOSIS — L282 Other prurigo: Secondary | ICD-10-CM

## 2020-04-18 MED ORDER — CLOBETASOL PROPIONATE 0.05 % EX CREA: 1.0000 "application " | TOPICAL_CREAM | Freq: Two times a day (BID) | CUTANEOUS | 0 refills | Status: DC

## 2020-04-18 MED ORDER — HYDROCORTISONE ACETATE 25 MG RE SUPP: 25.0000 mg | Freq: Every day | RECTAL | 0 refills | Status: DC

## 2020-04-18 NOTE — Patient Instructions (Addendum)
Work on low cholesterol low animal fat diet.  As feeling better try to work on regular exercise. May return for fasting cholesterol recheck in 3-6 months. Try rectal suppositories daily x 6 days.. call if symptoms not improving at that point.  Make sure no straining with bowel movements Keep are on leg clean and dry, cover with bandaid/antibiotic oitnment..   Please call the location of your choice from the menu below to schedule your Mammogram appointment.      [place]  1. [place] at [facility]   Phone:  [phone]   [place], [place] [address]                                            Services: 3D Mammogram and Bone Density  2. [place] at [facility])  Phone:  [phone]   [address] Room [place], [place] [address]                                              Services:  3D Mammogram and Bone Density

## 2020-04-18 NOTE — Assessment & Plan Note (Signed)
Treat with suppository... if not improving refer to GI.Marland Kitchen. ? She reports the external skin tags are mainly what is bothering her though.. ? General surgery referral.

## 2020-04-18 NOTE — Progress Notes (Addendum)
Patient ID: Jennifer Mendez, female    DOB: 05/12/1965, 55 y.o.   MRN: [id number]  This visit was conducted in person.  Blood pressure 104/70, pulse 78, temperature 98.2 F (36.8 C), temperature source Temporal, height 5' 0.5" (1.537 m), weight 132 lb 12 oz (60.2 kg), SpO2 97 %.  CC:  Chief Complaint  Patient presents with  . Annual Exam    Subjective:   HPI: Jennifer Mendez is a 55 y.o. female presenting on 04/18/2020 for Annual Exam  Elevated Cholesterol:   LDL very high..  Lab Results  Component Value Date   CHOL 279 (H) 04/11/2020   HDL 49.10 04/11/2020   LDLCALC 207 (H) 04/11/2020   LDLDIRECT 161.0 05/16/2011   TRIG 119.0 04/11/2020   CHOLHDL 6 04/11/2020   Using medications without problems: Muscle aches:  Diet compliance: healthy..  No fried foods Exercise: none Other complaints: The 10-year ASCVD risk score Mikey Bussing DC Brooke Bonito., et al., 2013) is: 1.7%   Values used to calculate the score:     Age: 67 years     Sex: Female     Is Non-Hispanic African American: No     Diabetic: No     Tobacco smoker: No     Systolic Blood Pressure: 841 mmHg     Is BP treated: No     HDL Cholesterol: 49.1 mg/dL     Total Cholesterol: 279 mg/dL  She has a skin lesion on left lower leg.. increased size of lesion, no bleeding.  She has recurrent issues with hemorrhoid. She does sitz baths,  prepH  Has had for 2-3 years... area is painful, not really itching.  off and on bleeding in rectum. Saw Dr. Glori Bickers 01/2019.. PE described: Small internal hemorrhoid noted at 9:00 - with mild prolapse  No constipation.Marland Kitchen keeps up with fiber Anusol cream in 2020 did not help.     Relevant past medical, surgical, family and social history reviewed and updated as indicated. Interim medical history since our last visit reviewed. Allergies and medications reviewed and updated. Outpatient Medications Prior to Visit  Medication Sig Dispense Refill  . hydrocortisone (ANUSOL-HC) 25 MG suppository Place  1 suppository (25 mg total) rectally 2 (two) times daily as needed for hemorrhoids or anal itching. (Patient not taking: Reported on 03/29/2019) 12 suppository 0  . levocetirizine (XYZAL) 5 MG tablet Take 10 mg by mouth.   11  . levonorgestrel (MIRENA) 20 MCG/24HR IUD 1 each by Intrauterine route once.      . Multiple Vitamin (MULTIVITAMIN) tablet Take 1 tablet by mouth daily.      Marland Kitchen triamcinolone cream (KENALOG) 0.1 % Apply 1 application topically 2 (two) times daily. To affected areas 30 g 0   No facility-administered medications prior to visit.     Per HPI unless specifically indicated in ROS section below Review of Systems  Constitutional: Negative for fatigue and fever.  HENT: Negative for congestion.   Eyes: Negative for pain.  Respiratory: Negative for cough and shortness of breath.   Cardiovascular: Negative for chest pain, palpitations and leg swelling.  Gastrointestinal: Negative for abdominal pain.  Genitourinary: Negative for dysuria and vaginal bleeding.  Musculoskeletal: Negative for back pain.  Neurological: Negative for syncope, light-headedness and headaches.  Psychiatric/Behavioral: Negative for dysphoric mood.   Objective:  Temp 98.2 F (36.8 C) (Temporal)   Ht 5' 0.5" (1.537 m)   Wt 132 lb 12 oz (60.2 kg)   BMI 25.50 kg/m   Wt Readings from Last 3  Encounters:  04/18/20 132 lb 12 oz (60.2 kg)  03/29/19 130 lb 6.4 oz (59.1 kg)  01/08/19 132 lb (59.9 kg)      Physical Exam Constitutional:      General: She is not in acute distress.    Appearance: Normal appearance. She is well-developed. She is not ill-appearing or toxic-appearing.  HENT:     Head: Normocephalic.     Right Ear: Hearing, tympanic membrane, ear canal and external ear normal. Tympanic membrane is not erythematous, retracted or bulging.     Left Ear: Hearing, tympanic membrane, ear canal and external ear normal. Tympanic membrane is not erythematous, retracted or bulging.     Nose: Nose normal.  No mucosal edema or rhinorrhea.     Right Sinus: No maxillary sinus tenderness or frontal sinus tenderness.     Left Sinus: No maxillary sinus tenderness or frontal sinus tenderness.     Mouth/Throat:     Pharynx: Uvula midline.  Eyes:     General: Lids are normal. Lids are everted, no foreign bodies appreciated.     Conjunctiva/sclera: Conjunctivae normal.     Pupils: Pupils are equal, round, and reactive to light.  Neck:     Thyroid: No thyroid mass or thyromegaly.     Vascular: No carotid bruit.     Trachea: Trachea normal.  Cardiovascular:     Rate and Rhythm: Normal rate and regular rhythm.     Pulses: Normal pulses.     Heart sounds: Normal heart sounds, S1 normal and S2 normal. No murmur heard. No friction rub. No gallop.   Pulmonary:     Effort: Pulmonary effort is normal. No tachypnea or respiratory distress.     Breath sounds: Normal breath sounds. No decreased breath sounds, wheezing, rhonchi or rales.  Abdominal:     General: Bowel sounds are normal. There is no distension or abdominal bruit.     Palpations: Abdomen is soft. There is no fluid wave or mass.     Tenderness: There is no abdominal tenderness. There is no guarding or rebound.     Hernia: No hernia is present.  Genitourinary:    Rectum: Guaiac result positive. Internal hemorrhoid present. No mass or external hemorrhoid.     Comments: External skin tags x 2 large, do not appear irritated ANOSCOPY performed with patient consent: grade 1 internal hemorrhoids visualized, with no act bleeding, no complications from the procedure Musculoskeletal:     Cervical back: Normal range of motion and neck supple.  Lymphadenopathy:     Cervical: No cervical adenopathy.  Skin:    General: Skin is warm and dry.     Findings: No rash.     Comments: Lesion warty 6 mm left lower leg, no irritation  Neurological:     Mental Status: She is alert.     Cranial Nerves: No cranial nerve deficit.     Sensory: No sensory deficit.   Psychiatric:        Mood and Affect: Mood is not anxious or depressed.        Speech: Speech normal.        Behavior: Behavior normal. Behavior is cooperative.        Thought Content: Thought content normal.        Judgment: Judgment normal.       Results for orders placed or performed in visit on 04/11/20  Comprehensive metabolic panel  Result Value Ref Range   Sodium 140 135 - 145 mEq/L   Potassium  4.2 3.5 - 5.1 mEq/L   Chloride 103 96 - 112 mEq/L   CO2 28 19 - 32 mEq/L   Glucose, Bld 98 70 - 99 mg/dL   BUN 25 (H) 6 - 23 mg/dL   Creatinine, Ser 0.74 0.40 - 1.20 mg/dL   Total Bilirubin 0.3 0.2 - 1.2 mg/dL   Alkaline Phosphatase 79 39 - 117 U/L   AST 22 0 - 37 U/L   ALT 30 0 - 35 U/L   Total Protein 7.6 6.0 - 8.3 g/dL   Albumin 4.3 3.5 - 5.2 g/dL   GFR 93.10 >60.00 mL/min   Calcium 9.7 8.4 - 10.5 mg/dL  Lipid panel  Result Value Ref Range   Cholesterol 279 (H) 0 - 200 mg/dL   Triglycerides 119.0 0.0 - 149.0 mg/dL   HDL 49.10 >39.00 mg/dL   VLDL 23.8 0.0 - 40.0 mg/dL   LDL Cholesterol 207 (H) 0 - 99 mg/dL   Total CHOL/HDL Ratio 6    NonHDL 230.32     This visit occurred during the SARS-CoV-2 public health emergency.  Safety protocols were in place, including screening questions prior to the visit, additional usage of staff PPE, and extensive cleaning of exam room while observing appropriate contact time as indicated for disinfecting solutions.   COVID 19 screen:  No recent travel or known exposure to COVID19 The patient denies respiratory symptoms of COVID 19 at this time. The importance of social distancing was discussed today.   Assessment and Plan   The patient's preventative maintenance and recommended screening tests for an annual wellness exam were reviewed in full today. Brought up to date unless services declined.  Counselled on the importance of diet, exercise, and its role in overall health and mortality. The patient's FH and SH was reviewed, including  their home life, tobacco status, and drug and alcohol status.   Last GYN OV Dr. Ernestina Patches Vaccines:  COVID x 3, due for Td.  Consider shingrix. Nonsmoker Colon: 2020 Dr.Vanga, normal.. no hemorrhoids seen , but skin tags seen. Mammo due.. will set up.  Pap/DVE at GYN 03/2019 HIV: refused.   Procedure note: Procedure Note for Cryotherapy Dx: warty lesion.. wart vs karatinous horn Location: left lower leg Size: 6 mm Patient gave consent for procedure . Patient notified of potential for scarring at site of cryotherapy. Area of concern treated with cryotherapy for 3 freeze thaw cycles with a 1 mm border of freeze. No complications.  Problem List Items Addressed This Visit    Chronic papular urticaria    Refilled topical clobetasol.      Hemorrhoids, internal    Treat with suppository... if not improving refer to GI.Marland Kitchen. ? She reports the external skin tags are mainly what is bothering her though.. ? General surgery referral.      Hyperlipidemia    Not at goal . Encouraged exercise, weight loss, healthy eating habits. Work on low cholesterol low animal fat diet.  As feeling better try to work on regular exercise. May return for fasting cholesterol recheck in 3-6 months.       Skin lesion of left lower limb    Treated with cryotherapy  Consent obtained verbally.  Treated with three free thaw cycles without complications.        Other Visit Diagnoses    Routine general medical examination at a health care facility    -  Primary   Relevant Orders   Tdap vaccine greater than or equal to 7yo IM (Completed)  Varicella-zoster vaccine IM (Shingrix) (Completed)   Rectal pain       Internal hemorrhoid       Need for Tdap vaccination       Relevant Orders   Tdap vaccine greater than or equal to 7yo IM (Completed)   Need for shingles vaccine       Relevant Orders   Varicella-zoster vaccine IM (Shingrix) (Completed)        Eliezer Lofts, MD

## 2020-04-18 NOTE — Assessment & Plan Note (Signed)
Treated with cryotherapy  Consent obtained verbally.  Treated with three free thaw cycles without complications.

## 2020-04-18 NOTE — Assessment & Plan Note (Signed)
Refilled topical clobetasol.

## 2020-04-18 NOTE — Assessment & Plan Note (Signed)
Not at goal . Encouraged exercise, weight loss, healthy eating habits. Work on low cholesterol low animal fat diet.  As feeling better try to work on regular exercise. May return for fasting cholesterol recheck in 3-6 months.

## 2020-05-01 ENCOUNTER — Other Ambulatory Visit: Payer: Self-pay | Admitting: Family Medicine

## 2020-05-01 DIAGNOSIS — Z1231 Encounter for screening mammogram for malignant neoplasm of breast: Secondary | ICD-10-CM

## 2020-06-01 ENCOUNTER — Ambulatory Visit
Admission: RE | Admit: 2020-06-01 | Discharge: 2020-06-01 | Disposition: A | Discharge status: Home / Self Care | Payer: BLUE CROSS/BLUE SHIELD | Source: Ambulatory Visit | Attending: Family Medicine | Admitting: Family Medicine

## 2020-06-01 ENCOUNTER — Other Ambulatory Visit: Payer: Self-pay

## 2020-06-01 DIAGNOSIS — Z1231 Encounter for screening mammogram for malignant neoplasm of breast: Secondary | ICD-10-CM | POA: Diagnosis not present

## 2020-06-29 ENCOUNTER — Ambulatory Visit: Payer: BLUE CROSS/BLUE SHIELD

## 2020-07-04 ENCOUNTER — Other Ambulatory Visit: Payer: Self-pay

## 2020-07-04 ENCOUNTER — Ambulatory Visit (INDEPENDENT_AMBULATORY_CARE_PROVIDER_SITE_OTHER): Payer: BLUE CROSS/BLUE SHIELD | Admitting: *Deleted

## 2020-07-04 DIAGNOSIS — Z23 Encounter for immunization: Secondary | ICD-10-CM

## 2020-07-04 NOTE — Progress Notes (Signed)
Per orders of Dr. Diona Browner, injection of Shingrix #2 given by Virl Cagey. Patient tolerated injection well.

## 2020-07-11 ENCOUNTER — Ambulatory Visit: Payer: BLUE CROSS/BLUE SHIELD

## 2020-10-13 ENCOUNTER — Telehealth: Payer: Self-pay | Admitting: Family Medicine

## 2020-10-13 DIAGNOSIS — E7800 Pure hypercholesterolemia, unspecified: Secondary | ICD-10-CM

## 2020-10-13 NOTE — Telephone Encounter (Signed)
-----   Message from Ellamae Sia sent at 10/02/2020 10:26 AM EDT ----- Regarding: Lab orders for Thursday, 9.15.22 Lab orders for a 6 month follow up appt

## 2020-10-20 ENCOUNTER — Other Ambulatory Visit: Payer: BLUE CROSS/BLUE SHIELD

## 2021-01-30 ENCOUNTER — Ambulatory Visit: Payer: BC Managed Care – PPO | Admitting: Family

## 2021-02-06 ENCOUNTER — Ambulatory Visit: Payer: BC Managed Care – PPO | Admitting: Family

## 2021-02-07 ENCOUNTER — Telehealth: Payer: Self-pay | Admitting: Family Medicine

## 2021-02-07 NOTE — Telephone Encounter (Signed)
Jennifer Mendez called in wanted to know about getting a referral for ortho due to both wrist pain. I offered her an appointment with Dr. Lorelei Pont but denied due to we are in [place]. And she wantd someone in [place]

## 2021-02-08 ENCOUNTER — Other Ambulatory Visit: Payer: Self-pay | Admitting: Family Medicine

## 2021-02-08 DIAGNOSIS — M25531 Pain in right wrist: Secondary | ICD-10-CM

## 2021-02-13 DIAGNOSIS — M25531 Pain in right wrist: Secondary | ICD-10-CM | POA: Diagnosis not present

## 2021-02-13 DIAGNOSIS — M25532 Pain in left wrist: Secondary | ICD-10-CM | POA: Diagnosis not present

## 2022-01-29 IMAGING — MG MM DIGITAL SCREENING BILAT W/ TOMO AND CAD
8 series · 9 of 24 positions shown · non-contrast
Comparison: Previous exam(s).

CLINICAL DATA: Screening.

EXAM:
DIGITAL SCREENING BILATERAL MAMMOGRAM WITH TOMOSYNTHESIS AND CAD
TECHNIQUE: Bilateral screening digital craniocaudal and mediolateral oblique
mammograms were obtained. Bilateral screening digital breast
tomosynthesis was performed. The images were evaluated with
computer-aided detection.

[L MLO synth-2D]
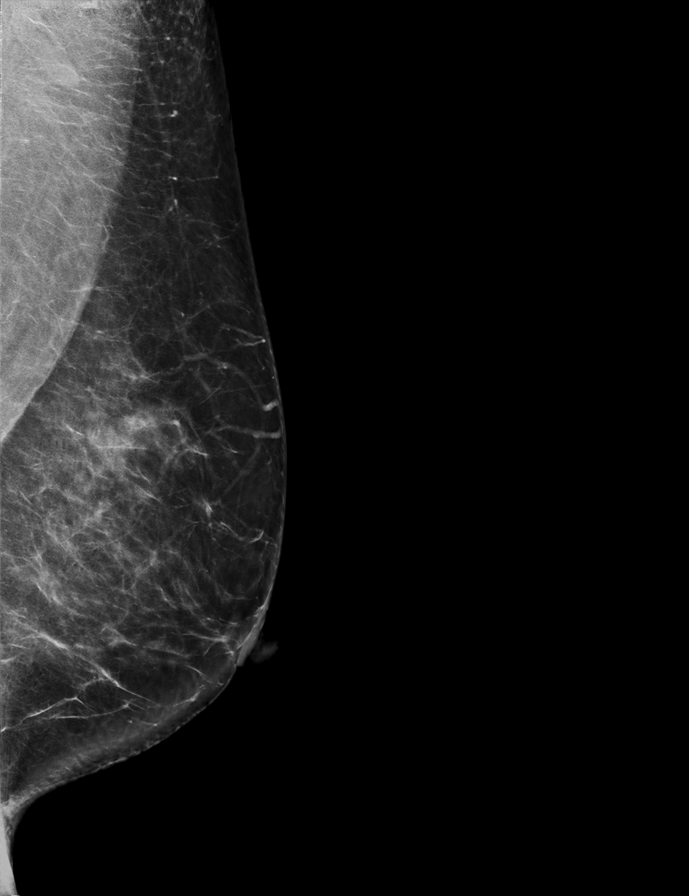

[L CC synth-2D]
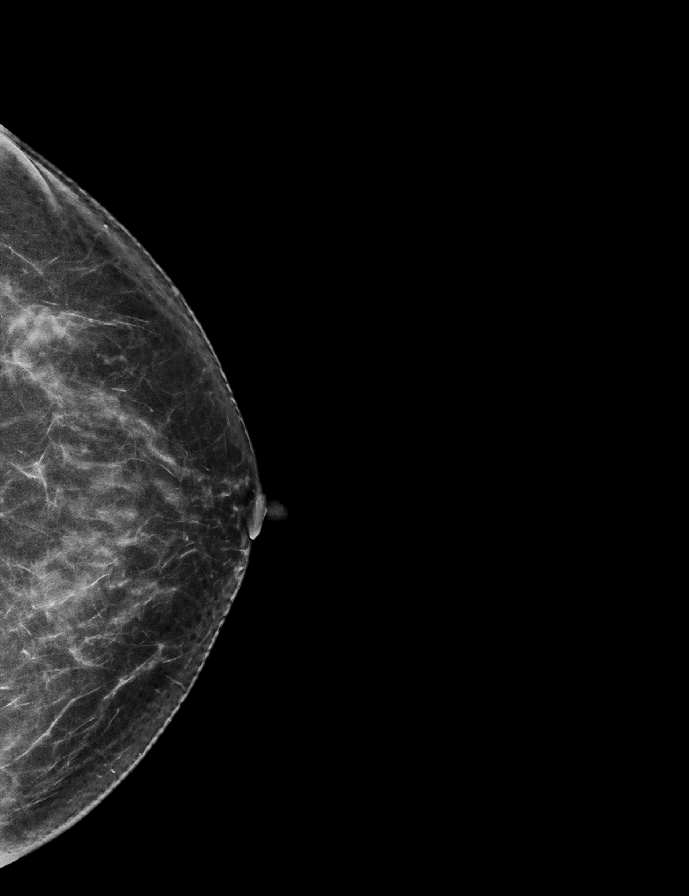

[R CC synth-2D]
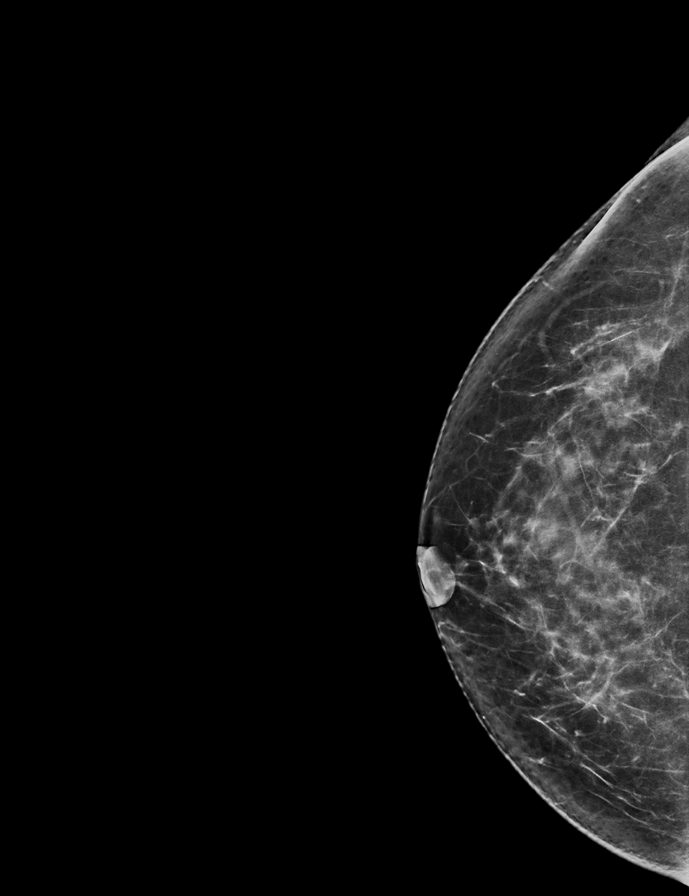

[R MLO synth-2D]
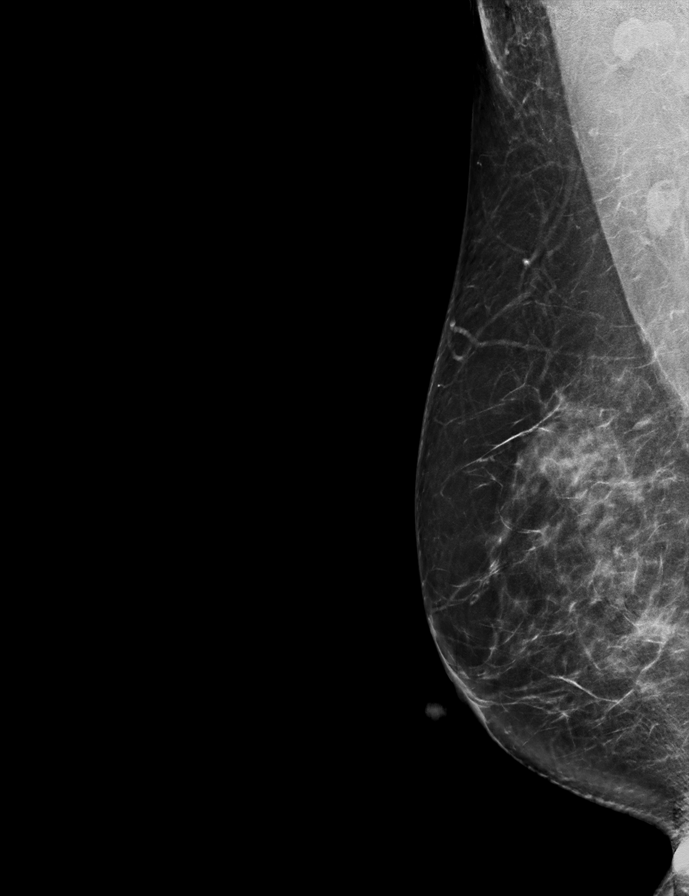

[R MLO tomo · 2 of 74 frames shown]
[frame 24/74]
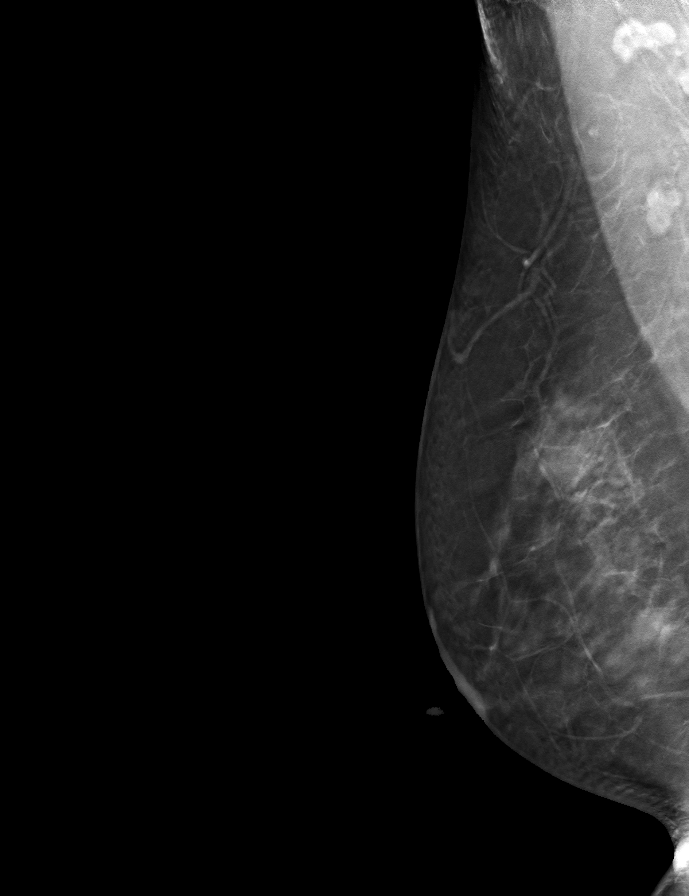
[frame 37/74]
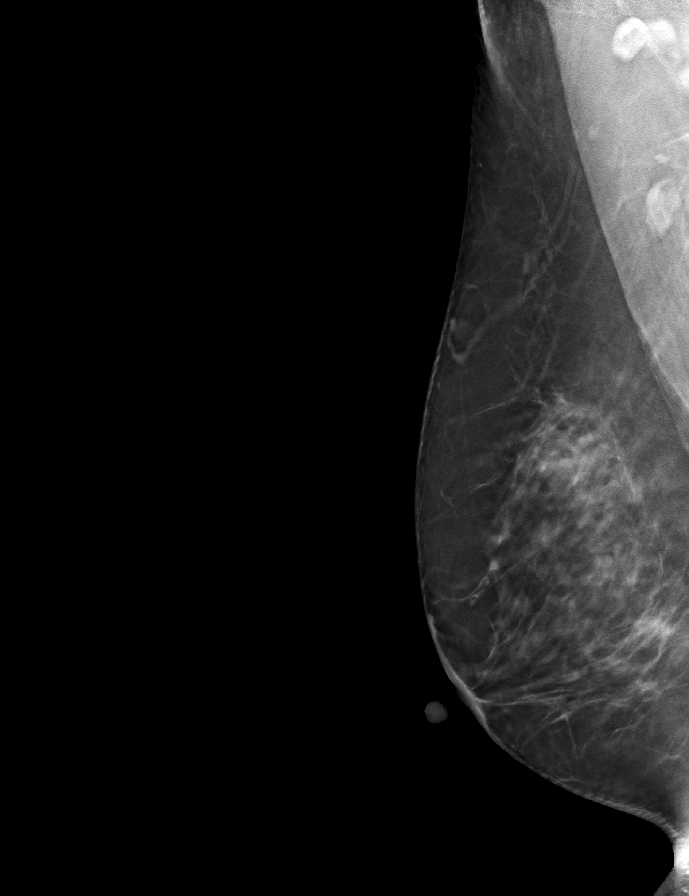

[L CC tomo · tomo slice 33/65.0]
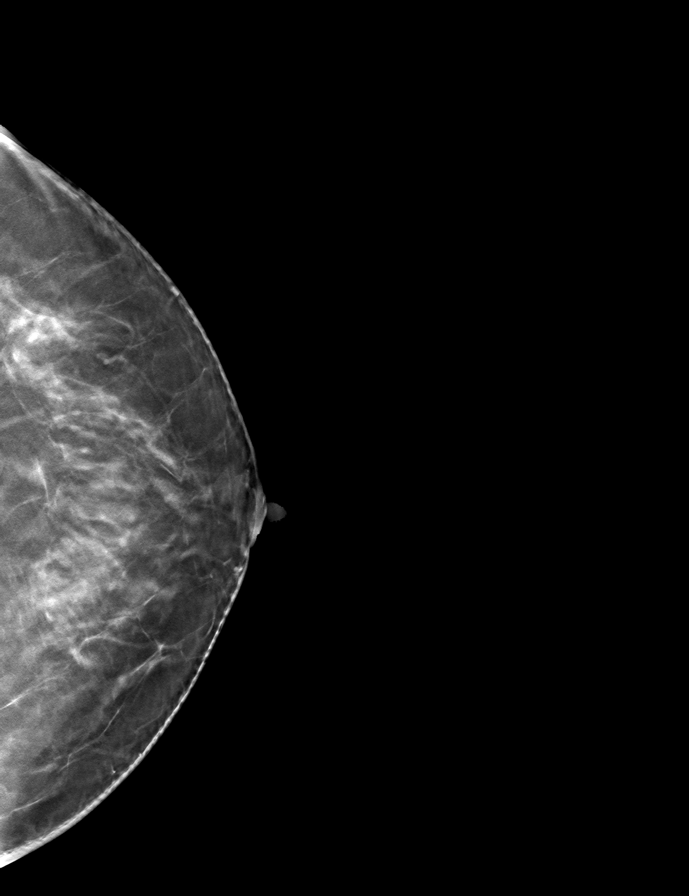

[R CC tomo · tomo slice 38/75.0]
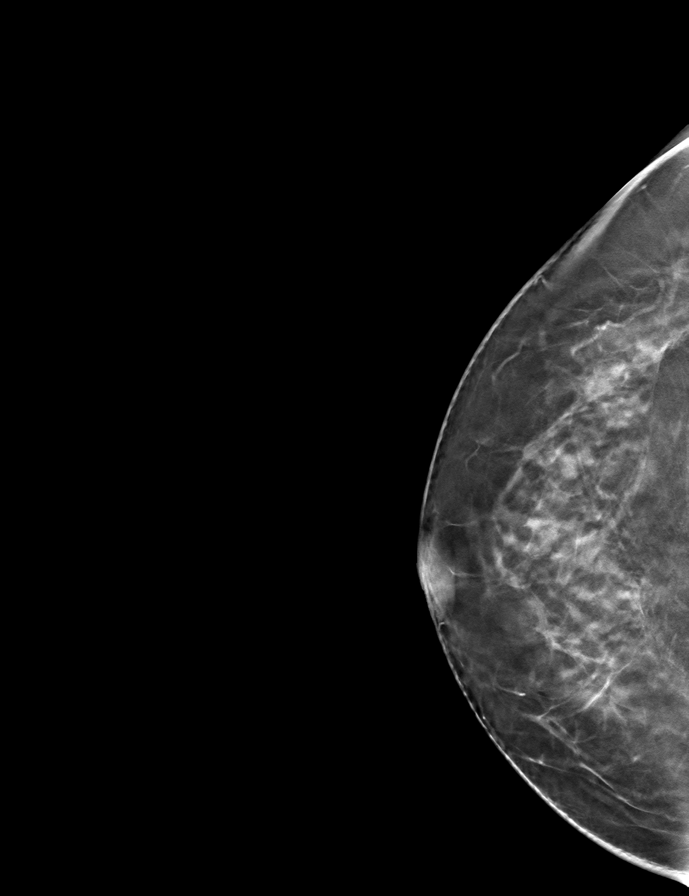

[L MLO tomo · tomo slice 37/72.0]
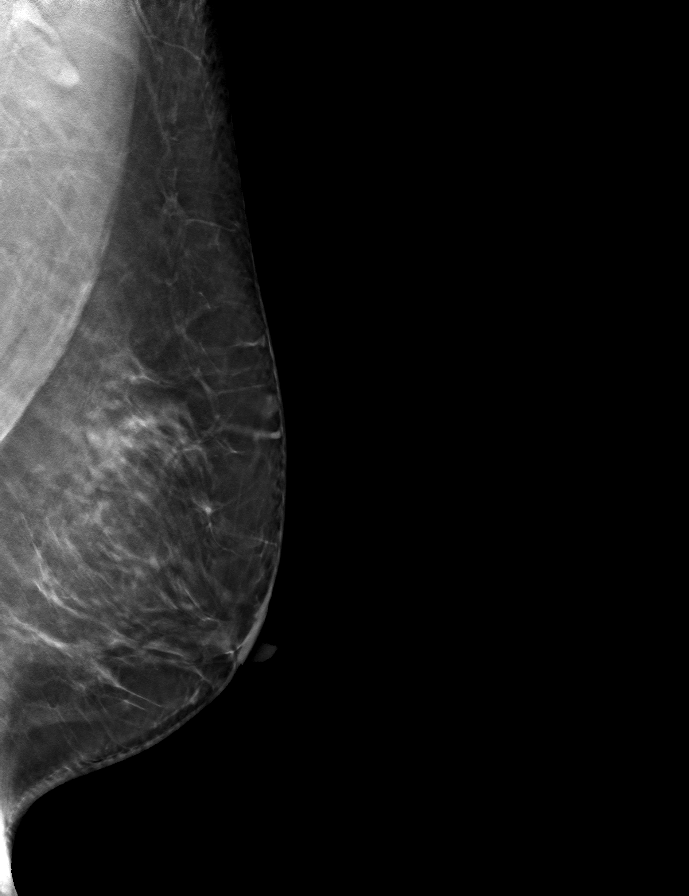

[9 of 24 positions shown; findings below may reference images not displayed]

ACR Breast Density Category c: The breast tissue is heterogeneously
dense, which may obscure small masses.
FINDINGS: There are no findings suspicious for malignancy. The images were
evaluated with computer-aided detection.
IMPRESSION: No mammographic evidence of malignancy. A result letter of this
screening mammogram will be mailed directly to the patient.

RECOMMENDATION:
Screening mammogram in one year. (Code:T4-5-GWO)

BI-RADS CATEGORY  1: Negative.

## 2022-03-07 ENCOUNTER — Other Ambulatory Visit: Payer: Self-pay | Admitting: Family Medicine

## 2022-03-07 DIAGNOSIS — Z1231 Encounter for screening mammogram for malignant neoplasm of breast: Secondary | ICD-10-CM

## 2022-03-13 ENCOUNTER — Encounter: Payer: Self-pay | Admitting: Family Medicine

## 2022-03-13 ENCOUNTER — Ambulatory Visit: Payer: No Typology Code available for payment source | Admitting: Family Medicine

## 2022-03-13 VITALS — BP 109/75 | HR 88 | Ht 61.0 in | Wt 132.0 lb

## 2022-03-13 DIAGNOSIS — N912 Amenorrhea, unspecified: Secondary | ICD-10-CM | POA: Diagnosis not present

## 2022-03-13 DIAGNOSIS — Z01419 Encounter for gynecological examination (general) (routine) without abnormal findings: Secondary | ICD-10-CM

## 2022-03-13 DIAGNOSIS — Z30432 Encounter for removal of intrauterine contraceptive device: Secondary | ICD-10-CM

## 2022-03-13 NOTE — Progress Notes (Signed)
   GYNECOLOGY ANNUAL PREVENTATIVE CARE ENCOUNTER NOTE  Subjective:   Jennifer Mendez is a 57 y.o. G64P1 female here for a routine annual gynecologic exam.  Current complaints: concerned about IUD, has been in place for many years and thinks it might be expired. Does not have cycles. Reports fatigue and some minor hot flashes but not severe. Does not know avg age of menopause in the women in her family.    Denies abnormal vaginal bleeding, discharge, pelvic pain, problems with intercourse or other gynecologic concerns.    Gynecologic History No LMP recorded. (Menstrual status: IUD). Contraception: IUD/likely postmenopausal Last Pap: 2021. Results were: normal Last mammogram: 2022. Results were: normal  Health Maintenance Due  Topic Date Due   HIV Screening  Never done   MAMMOGRAM  06/01/2021   INFLUENZA VACCINE  09/04/2021   COVID-19 Vaccine (4 - 2023-24 season) 10/05/2021    The following portions of the patient's history were reviewed and updated as appropriate: allergies, current medications, past family history, past medical history, past social history, past surgical history and problem list.  Review of Systems Pertinent items are noted in HPI.   Objective:  BP 109/75   Pulse 88   Ht '5\' 1"'$  (1.549 m)   Wt 132 lb (59.9 kg)   BMI 24.94 kg/m  CONSTITUTIONAL: Well-developed, well-nourished female in no acute distress.  HENT:  Normocephalic, atraumatic, External right and left ear normal. Oropharynx is clear and moist EYES:  No scleral icterus.  NECK: Normal range of motion, supple, no masses.  Normal thyroid.  SKIN: Skin is warm and dry. No rash noted. Not diaphoretic. No erythema. No pallor. NEUROLOGIC: Alert and oriented to person, place, and time. Normal reflexes, muscle tone coordination. No cranial nerve deficit noted. PSYCHIATRIC: Normal mood and affect. Normal behavior. Normal judgment and thought content. CARDIOVASCULAR: Normal heart rate noted, regular rhythm. 2+  distal pulses. RESPIRATORY: Effort and breath sounds normal, no problems with respiration noted. BREASTS: Symmetric in size. No masses, skin changes, nipple drainage, or lymphadenopathy. ABDOMEN: Soft,  no distention noted.  No tenderness, rebound or guarding.  PELVIC: Normal appearing external genitalia; normal appearing vaginal mucosa and cervix.  No abnormal discharge noted.  IUD strings seen  Normal uterine size, no other palpable masses, no uterine or adnexal tenderness. Chaperone present for exam MUSCULOSKELETAL: Normal range of motion.   PROCEDURE NOTE: IUD Removal  Patient identified, informed consent performed, consent signed.  Patient was in the dorsal lithotomy position, normal external genitalia was noted.  A speculum was placed in the patient's vagina, normal discharge was noted, no lesions. The cervix was visualized, no lesions, no abnormal discharge.  The strings of the IUD were grasped and pulled using ring forceps. The IUD was removed in its entirety.   Assessment and Plan:  1) Annual gynecologic examination:  Reviewed perimenopausal symptoms and management.  - pap is up to date  2) Contraception counseling: IUD removed today, likely has gone through menopause  1. Amenorrhea FSH today to confirm menopause  2. Well woman exam with routine gynecological exam   Please refer to After Visit Summary for other counseling recommendations.   Return in about 1 year (around 03/14/2023) for Yearly wellness exam.  Caren Macadam, MD, MPH, ABFM Attending [facility] for [facility]

## 2022-03-13 NOTE — Progress Notes (Signed)
Patient presents for Annual. Last Seen 03/29/19   Per Notes Possible IUD Removal.   LMP:NO CYCLES W/IUD  Last pap:03/29/19 WNL  Contraception:IUD Mirena pt unsure if she needs IUD anymore.  Inserted 10/14/14 Mammogram: 06/01/20 scheduled 03/26/22 Declines Breast Exam today.  CC: None   Fun Fact: Patient likes fishing and gardening.

## 2022-03-14 LAB — FOLLICLE STIMULATING HORMONE: FSH: 62.1 m[IU]/mL

## 2022-03-20 ENCOUNTER — Telehealth: Payer: Self-pay | Admitting: Family Medicine

## 2022-03-20 DIAGNOSIS — E7800 Pure hypercholesterolemia, unspecified: Secondary | ICD-10-CM

## 2022-03-20 NOTE — Telephone Encounter (Signed)
-----   Message from Velna Hatchet, RT sent at 03/19/2022 11:40 AM EST ----- Regarding: Fri 2/16 lab Patient is scheduled for cpx, please order future labs.  Thanks, Anda Kraft

## 2022-03-22 ENCOUNTER — Other Ambulatory Visit (INDEPENDENT_AMBULATORY_CARE_PROVIDER_SITE_OTHER): Payer: No Typology Code available for payment source

## 2022-03-22 DIAGNOSIS — E7800 Pure hypercholesterolemia, unspecified: Secondary | ICD-10-CM

## 2022-03-22 LAB — COMPREHENSIVE METABOLIC PANEL
ALT: 33 U/L (ref 0–35)
AST: 24 U/L (ref 0–37)
Albumin: 4.3 g/dL (ref 3.5–5.2)
Alkaline Phosphatase: 85 U/L (ref 39–117)
BUN: 30 mg/dL — ABNORMAL HIGH (ref 6–23)
CO2: 26 mEq/L (ref 19–32)
Calcium: 9.4 mg/dL (ref 8.4–10.5)
Chloride: 106 mEq/L (ref 96–112)
Creatinine, Ser: 0.68 mg/dL (ref 0.40–1.20)
GFR: 98.86 mL/min (ref >60.00)
Glucose, Bld: 103 mg/dL — ABNORMAL HIGH (ref 70–99)
Potassium: 4.1 mEq/L (ref 3.5–5.1)
Sodium: 139 mEq/L (ref 135–145)
Total Bilirubin: 0.3 mg/dL (ref 0.2–1.2)
Total Protein: 7.5 g/dL (ref 6.0–8.3)

## 2022-03-22 LAB — LIPID PANEL
Cholesterol: 266 mg/dL — ABNORMAL HIGH (ref 0–200)
HDL: 40.4 mg/dL (ref >39.00)
LDL Cholesterol: 192 mg/dL — ABNORMAL HIGH (ref 0–99)
NonHDL: 225.34
Total CHOL/HDL Ratio: 7
Triglycerides: 165 mg/dL — ABNORMAL HIGH (ref 0.0–149.0)
VLDL: 33 mg/dL (ref 0.0–40.0)

## 2022-03-26 ENCOUNTER — Ambulatory Visit
Admission: RE | Admit: 2022-03-26 | Discharge: 2022-03-26 | Disposition: A | Discharge status: Home / Self Care | Payer: No Typology Code available for payment source | Source: Ambulatory Visit | Attending: Family Medicine | Admitting: Family Medicine

## 2022-03-26 DIAGNOSIS — Z1231 Encounter for screening mammogram for malignant neoplasm of breast: Secondary | ICD-10-CM | POA: Diagnosis present

## 2022-03-26 NOTE — Progress Notes (Signed)
No critical labs need to be addressed urgently. We will discuss labs in detail at upcoming office visit.   

## 2022-03-29 ENCOUNTER — Ambulatory Visit (INDEPENDENT_AMBULATORY_CARE_PROVIDER_SITE_OTHER): Payer: No Typology Code available for payment source | Admitting: Family Medicine

## 2022-03-29 ENCOUNTER — Encounter: Payer: Self-pay | Admitting: *Deleted

## 2022-03-29 ENCOUNTER — Encounter: Payer: Self-pay | Admitting: Family Medicine

## 2022-03-29 ENCOUNTER — Telehealth: Payer: Self-pay | Admitting: *Deleted

## 2022-03-29 VITALS — BP 100/62 | HR 74 | Temp 97.9°F | Ht 61.0 in | Wt 132.2 lb

## 2022-03-29 DIAGNOSIS — Z23 Encounter for immunization: Secondary | ICD-10-CM | POA: Diagnosis not present

## 2022-03-29 DIAGNOSIS — Z0000 Encounter for general adult medical examination without abnormal findings: Secondary | ICD-10-CM | POA: Diagnosis not present

## 2022-03-29 DIAGNOSIS — E7800 Pure hypercholesterolemia, unspecified: Secondary | ICD-10-CM | POA: Diagnosis not present

## 2022-03-29 DIAGNOSIS — R7303 Prediabetes: Secondary | ICD-10-CM | POA: Diagnosis not present

## 2022-03-29 DIAGNOSIS — L659 Nonscarring hair loss, unspecified: Secondary | ICD-10-CM

## 2022-03-29 NOTE — Progress Notes (Signed)
Patient ID: Jennifer Mendez, female    DOB: [birth year], 57 y.o.   MRN: [id number]  This visit was conducted in person.  BP 100/62   Pulse 74   Temp 97.9 F (36.6 C) (Temporal)   Ht '5\' 1"'$  (1.549 m)   Wt 132 lb 4 oz (60 kg)   LMP 06/04/2010   SpO2 98%   BMI 24.99 kg/m    CC:  Chief Complaint  Patient presents with   Annual Exam    Subjective:   HPI: Jennifer Mendez is a 57 y.o. female presenting on 03/29/2022 for Annual Exam  The patient presents for complete physical and review of chronic health problems. She also has the following acute concerns today:  Alopecia.Marland Kitchen diffuse thinning.  Saw dermatologist.. tested blood .Marland Kitchen Not sure what tests.  Prescribed vit E.    Elevated Cholesterol:  Lab Results  Component Value Date   CHOL 266 (H) 03/22/2022   HDL 40.40 03/22/2022   LDLCALC 192 (H) 03/22/2022   LDLDIRECT 161.0 05/16/2011   TRIG 165.0 (H) 03/22/2022   CHOLHDL 7 03/22/2022   Using medications without problems: Muscle aches:  Diet compliance: high sugar and fat Exercise: minimal Other complaints:  The 10-year ASCVD risk score (Arnett DK, et al., 2019) is: 2.1%   Values used to calculate the score:     Age: 69 years     Sex: Female     Is Non-Hispanic African American: No     Diabetic: No     Tobacco smoker: No     Systolic Blood Pressure: 123XX123 mmHg     Is BP treated: No     HDL Cholesterol: 40.4 mg/dL     Total Cholesterol: 266 mg/dL   Wt Readings from Last 3 Encounters:  03/29/22 132 lb 4 oz (60 kg)  03/13/22 132 lb (59.9 kg)  04/18/20 132 lb 12 oz (60.2 kg)     Relevant past medical, surgical, family and social history reviewed and updated as indicated. Interim medical history since our last visit reviewed. Allergies and medications reviewed and updated. Outpatient Medications Prior to Visit  Medication Sig Dispense Refill   Multiple Vitamin (MULTIVITAMIN) tablet Take 1 tablet by mouth daily.     clobetasol cream (TEMOVATE) [id number] % Apply 1  application topically 2 (two) times daily. (Patient not taking: Reported on 03/13/2022) 30 g 0   hydrocortisone (ANUSOL-HC) 25 MG suppository Place 1 suppository (25 mg total) rectally daily. (Patient not taking: Reported on 03/13/2022) 6 suppository 0   levonorgestrel (MIRENA) 20 MCG/24HR IUD 1 each by Intrauterine route once. (Patient not taking: Reported on 03/13/2022)     No facility-administered medications prior to visit.     Per HPI unless specifically indicated in ROS section below Review of Systems  Constitutional:  Negative for fatigue and fever.  HENT:  Negative for congestion.   Eyes:  Negative for pain.  Respiratory:  Negative for cough and shortness of breath.   Cardiovascular:  Negative for chest pain, palpitations and leg swelling.  Gastrointestinal:  Negative for abdominal pain.  Genitourinary:  Negative for dysuria and vaginal bleeding.  Musculoskeletal:  Negative for back pain.  Neurological:  Negative for syncope, light-headedness and headaches.  Psychiatric/Behavioral:  Negative for dysphoric mood.     Objective:  BP 100/62   Pulse 74   Temp 97.9 F (36.6 C) (Temporal)   Ht '5\' 1"'$  (1.549 m)   Wt 132 lb 4 oz (60 kg)   LMP 06/04/2010   SpO2  98%   BMI 24.99 kg/m   Wt Readings from Last 3 Encounters:  03/29/22 132 lb 4 oz (60 kg)  03/13/22 132 lb (59.9 kg)  04/18/20 132 lb 12 oz (60.2 kg)      Physical Exam Constitutional:      General: She is not in acute distress.    Appearance: Normal appearance. She is well-developed. She is not ill-appearing or toxic-appearing.  HENT:     Head: Normocephalic.     Right Ear: Hearing, tympanic membrane, ear canal and external ear normal. Tympanic membrane is not erythematous, retracted or bulging.     Left Ear: Hearing, tympanic membrane, ear canal and external ear normal. Tympanic membrane is not erythematous, retracted or bulging.     Nose: No mucosal edema or rhinorrhea.     Right Sinus: No maxillary sinus tenderness or  frontal sinus tenderness.     Left Sinus: No maxillary sinus tenderness or frontal sinus tenderness.     Mouth/Throat:     Pharynx: Uvula midline.  Eyes:     General: Lids are normal. Lids are everted, no foreign bodies appreciated.     Conjunctiva/sclera: Conjunctivae normal.     Pupils: Pupils are equal, round, and reactive to light.  Neck:     Thyroid: No thyroid mass or thyromegaly.     Vascular: No carotid bruit.     Trachea: Trachea normal.  Cardiovascular:     Rate and Rhythm: Normal rate and regular rhythm.     Pulses: Normal pulses.     Heart sounds: Normal heart sounds, S1 normal and S2 normal. No murmur heard.    No friction rub. No gallop.  Pulmonary:     Effort: Pulmonary effort is normal. No tachypnea or respiratory distress.     Breath sounds: Normal breath sounds. No decreased breath sounds, wheezing, rhonchi or rales.  Abdominal:     General: Bowel sounds are normal.     Palpations: Abdomen is soft.     Tenderness: There is no abdominal tenderness.  Musculoskeletal:     Cervical back: Normal range of motion and neck supple.  Skin:    General: Skin is warm and dry.     Findings: No rash.  Neurological:     Mental Status: She is alert.  Psychiatric:        Mood and Affect: Mood is not anxious or depressed.        Speech: Speech normal.        Behavior: Behavior normal. Behavior is cooperative.        Thought Content: Thought content normal.        Judgment: Judgment normal.       Results for orders placed or performed in visit on 03/22/22  Comprehensive metabolic panel  Result Value Ref Range   Sodium 139 135 - 145 mEq/L   Potassium 4.1 3.5 - 5.1 mEq/L   Chloride 106 96 - 112 mEq/L   CO2 26 19 - 32 mEq/L   Glucose, Bld 103 (H) 70 - 99 mg/dL   BUN 30 (H) 6 - 23 mg/dL   Creatinine, Ser 0.68 0.40 - 1.20 mg/dL   Total Bilirubin 0.3 0.2 - 1.2 mg/dL   Alkaline Phosphatase 85 39 - 117 U/L   AST 24 0 - 37 U/L   ALT 33 0 - 35 U/L   Total Protein 7.5 6.0 -  8.3 g/dL   Albumin 4.3 3.5 - 5.2 g/dL   GFR 98.86 >60.00 mL/min  Calcium 9.4 8.4 - 10.5 mg/dL  Lipid panel  Result Value Ref Range   Cholesterol 266 (H) 0 - 200 mg/dL   Triglycerides 165.0 (H) 0.0 - 149.0 mg/dL   HDL 40.40 >39.00 mg/dL   VLDL 33.0 0.0 - 40.0 mg/dL   LDL Cholesterol 192 (H) 0 - 99 mg/dL   Total CHOL/HDL Ratio 7    NonHDL 225.34     Assessment and Plan The patient's preventative maintenance and recommended screening tests for an annual wellness exam were reviewed in full today. Brought up to date unless services declined.  Counselled on the importance of diet, exercise, and its role in overall health and mortality. The patient's FH and SH was reviewed, including their home life, tobacco status, and drug and alcohol status.      See GYN Dr. Ernestina Patches Vaccines:  COVID x 3, due for Td, shingrix uptodate and flu given today  Nonsmoker  Colon: 2020 Dr.Vanga, normal.. no hemorrhoids seen , but skin tags seen.  Mammo  03/26/22  Pap/DVE at GYN 03/2019 HIV: refused.     Routine general medical examination at a health care facility  Need for influenza vaccination -     Flu Vaccine QUAD 9moIM (Fluarix, Fluzone & Alfiuria Quad PF)  Alopecia Assessment & Plan: Has seen dermatology.. Dr. NMilford Cage. will obtain records to verify TSH checked. ? If vit D was low.  Encouraged her to work on healthy lifestyle.   Prediabetes Assessment & Plan: New diagnosis, discussed low carbohydrate diet, increased activity and weight maintenance.   Pure hypercholesterolemia Assessment & Plan: Chronic, slight improvement in control.  Discussed increased risk of cardiovascular disease over time if cholesterol remains high.  She is not at the point where a statin is indicated given she is approximately 2% cardiovascular disease 10-year risk. Information provided on low-cholesterol diet and discussed lifestyle changes in detail.      Need for influenza vaccination -     Flu Vaccine QUAD  663moM (Fluarix, Fluzone & Alfiuria Quad PF)     No follow-ups on file.   AmEliezer LoftsMD

## 2022-03-29 NOTE — Assessment & Plan Note (Signed)
Chronic, slight improvement in control.  Discussed increased risk of cardiovascular disease over time if cholesterol remains high.  She is not at the point where a statin is indicated given she is approximately 2% cardiovascular disease 10-year risk. Information provided on low-cholesterol diet and discussed lifestyle changes in detail.

## 2022-03-29 NOTE — Patient Instructions (Addendum)
Work on low cholesterol diet. Decrease carbohydrates and sugar in diet.  Increase regular exercise.  Return for re-evaluation of cholesterol in 3-6 months.

## 2022-03-29 NOTE — Telephone Encounter (Signed)
-----   Message from Jinny Sanders, MD sent at 03/29/2022  2:15 PM EST ----- Call Let patient know I reviewed the labs obtained from dermatology regarding her hair loss.  It looks like all the appropriate labs were done and were normal except for low vitamin D.  I do encourage her to continue taking a vitamin D supplement. ----- Message ----- From: Carter Kitten, CMA Sent: 03/29/2022  12:45 PM EST To: Jinny Sanders, MD   ----- Message ----- From: Rosiland Oz Sent: 03/29/2022  12:42 PM EST To: Dierdre Forth

## 2022-03-29 NOTE — Assessment & Plan Note (Signed)
New diagnosis, discussed low carbohydrate diet, increased activity and weight maintenance.

## 2022-03-29 NOTE — Telephone Encounter (Signed)
Spoke with Jennifer Mendez that let her know that Dr. Diona Browner reviewed the labs obtained from dermatology regarding her hair loss.  It looks like all the appropriate labs were done and were normal except for low vitamin D.  I do encourage her to continue taking a vitamin D supplement. Patient states understanding.

## 2022-03-29 NOTE — Assessment & Plan Note (Signed)
Has seen dermatology.. Dr. Milford Cage.. will obtain records to verify TSH checked. ? If vit D was low.  Encouraged her to work on healthy lifestyle.

## 2023-08-21 ENCOUNTER — Other Ambulatory Visit: Payer: Self-pay | Admitting: Family Medicine

## 2023-08-21 DIAGNOSIS — Z1231 Encounter for screening mammogram for malignant neoplasm of breast: Secondary | ICD-10-CM

## 2023-08-22 ENCOUNTER — Ambulatory Visit
Admission: RE | Admit: 2023-08-22 | Discharge: 2023-08-22 | Disposition: A | Discharge status: Home / Self Care | Source: Ambulatory Visit | Attending: Family Medicine | Admitting: Family Medicine

## 2023-08-22 ENCOUNTER — Ambulatory Visit: Payer: Self-pay | Admitting: Family Medicine

## 2023-08-22 ENCOUNTER — Encounter: Payer: Self-pay | Admitting: Family Medicine

## 2023-08-22 ENCOUNTER — Ambulatory Visit (INDEPENDENT_AMBULATORY_CARE_PROVIDER_SITE_OTHER): Admitting: Family Medicine

## 2023-08-22 VITALS — BP 100/70 | HR 80 | Temp 98.1°F | Ht 61.0 in | Wt 132.4 lb

## 2023-08-22 DIAGNOSIS — E7800 Pure hypercholesterolemia, unspecified: Secondary | ICD-10-CM | POA: Diagnosis not present

## 2023-08-22 DIAGNOSIS — Z114 Encounter for screening for human immunodeficiency virus [HIV]: Secondary | ICD-10-CM | POA: Diagnosis not present

## 2023-08-22 DIAGNOSIS — Z1231 Encounter for screening mammogram for malignant neoplasm of breast: Secondary | ICD-10-CM | POA: Diagnosis present

## 2023-08-22 DIAGNOSIS — J020 Streptococcal pharyngitis: Secondary | ICD-10-CM | POA: Insufficient documentation

## 2023-08-22 DIAGNOSIS — Z0000 Encounter for general adult medical examination without abnormal findings: Secondary | ICD-10-CM

## 2023-08-22 LAB — COMPREHENSIVE METABOLIC PANEL WITH GFR
ALT: 34 U/L (ref 0–35)
AST: 26 U/L (ref 0–37)
Albumin: 4.7 g/dL (ref 3.5–5.2)
Alkaline Phosphatase: 84 U/L (ref 39–117)
BUN: 26 mg/dL — ABNORMAL HIGH (ref 6–23)
CO2: 29 meq/L (ref 19–32)
Calcium: 9.6 mg/dL (ref 8.4–10.5)
Chloride: 102 meq/L (ref 96–112)
Creatinine, Ser: 0.69 mg/dL (ref 0.40–1.20)
GFR: 97.54 mL/min (ref >60.00)
Glucose, Bld: 77 mg/dL (ref 70–99)
Potassium: 4.2 meq/L (ref 3.5–5.1)
Sodium: 141 meq/L (ref 135–145)
Total Bilirubin: 0.5 mg/dL (ref 0.2–1.2)
Total Protein: 7.7 g/dL (ref 6.0–8.3)

## 2023-08-22 LAB — POCT RAPID STREP A (OFFICE): Rapid Strep A Screen: POSITIVE — AB

## 2023-08-22 LAB — LIPID PANEL
Cholesterol: 259 mg/dL — ABNORMAL HIGH (ref 0–200)
HDL: 50.5 mg/dL (ref >39.00)
LDL Cholesterol: 174 mg/dL — ABNORMAL HIGH (ref 0–99)
NonHDL: 208.17
Total CHOL/HDL Ratio: 5
Triglycerides: 170 mg/dL — ABNORMAL HIGH (ref 0.0–149.0)
VLDL: 34 mg/dL (ref 0.0–40.0)

## 2023-08-22 MED ORDER — AMOXICILLIN 500 MG PO CAPS: 1000.0000 mg | ORAL_CAPSULE | Freq: Two times a day (BID) | ORAL | 0 refills | Status: AC

## 2023-08-22 NOTE — Progress Notes (Signed)
 Patient ID: Jennifer Mendez, female    DOB: 1965/06/15, 58 y.o.   MRN: [id number]  This visit was conducted in person.  BP 100/70   Pulse 80   Temp 98.1 F (36.7 C) (Temporal)   Ht 5' 1 (1.549 m)   Wt 132 lb 6 oz (60 kg)   LMP 06/04/2010   SpO2 95%   BMI 25.01 kg/m    CC:  Chief Complaint  Patient presents with   Annual Exam   Sore Throat    X 10 days    Subjective:   HPI: Jennifer Mendez is a 58 y.o. female presenting on 08/22/2023 for Annual Exam and Sore Throat (X 10 days)  The patient presents for complete physical and review of chronic health problems. She also has the following acute concerns today:  ST x 10 days.. has seen white spot left side.  No fever.  No runny nose, no cold symptoms.  No past nasal  drip.  No sick contacts.  No GERD   She has noted less desire.  S/p menopause    Elevated Cholesterol:  Due fo re-eval. Lab Results  Component Value Date   CHOL 266 (H) 03/22/2022   HDL 40.40 03/22/2022   LDLCALC 192 (H) 03/22/2022   LDLDIRECT 161.0 05/16/2011   TRIG 165.0 (H) 03/22/2022   CHOLHDL 7 03/22/2022   Using medications without problems: Muscle aches:  Diet compliance: moderate Exercise: working in garden. Other complaints:    Wt Readings from Last 3 Encounters:  08/22/23 132 lb 6 oz (60 kg)  03/29/22 132 lb 4 oz (60 kg)  03/13/22 132 lb (59.9 kg)     Relevant past medical, surgical, family and social history reviewed and updated as indicated. Interim medical history since our last visit reviewed. Allergies and medications reviewed and updated. Outpatient Medications Prior to Visit  Medication Sig Dispense Refill   Nutritional Supplements (NUTRITIONAL SUPPLEMENT PO) Take 1 packet by mouth in the morning and at bedtime. Relief Factor     Multiple Vitamin (MULTIVITAMIN) tablet Take 1 tablet by mouth daily.     No facility-administered medications prior to visit.     Per HPI unless specifically indicated in ROS section  below Review of Systems  Constitutional:  Negative for fatigue and fever.  HENT:  Negative for congestion.   Eyes:  Negative for pain.  Respiratory:  Negative for cough and shortness of breath.   Cardiovascular:  Negative for chest pain, palpitations and leg swelling.  Gastrointestinal:  Negative for abdominal pain.  Genitourinary:  Negative for dysuria and vaginal bleeding.  Musculoskeletal:  Negative for back pain.  Neurological:  Negative for syncope, light-headedness and headaches.  Psychiatric/Behavioral:  Negative for dysphoric mood.     Objective:  BP 100/70   Pulse 80   Temp 98.1 F (36.7 C) (Temporal)   Ht 5' 1 (1.549 m)   Wt 132 lb 6 oz (60 kg)   LMP 06/04/2010   SpO2 95%   BMI 25.01 kg/m   Wt Readings from Last 3 Encounters:  08/22/23 132 lb 6 oz (60 kg)  03/29/22 132 lb 4 oz (60 kg)  03/13/22 132 lb (59.9 kg)      Physical Exam Constitutional:      General: She is not in acute distress.    Appearance: Normal appearance. She is well-developed. She is not ill-appearing or toxic-appearing.  HENT:     Head: Normocephalic.     Right Ear: Hearing, tympanic membrane, ear canal and  external ear normal. Tympanic membrane is not erythematous, retracted or bulging.     Left Ear: Hearing, tympanic membrane, ear canal and external ear normal. Tympanic membrane is not erythematous, retracted or bulging.     Nose: No mucosal edema or rhinorrhea.     Right Sinus: No maxillary sinus tenderness or frontal sinus tenderness.     Left Sinus: No maxillary sinus tenderness or frontal sinus tenderness.     Mouth/Throat:     Pharynx: Uvula midline.  Eyes:     General: Lids are normal. Lids are everted, no foreign bodies appreciated.     Conjunctiva/sclera: Conjunctivae normal.     Pupils: Pupils are equal, round, and reactive to light.  Neck:     Thyroid: No thyroid mass or thyromegaly.     Vascular: No carotid bruit.     Trachea: Trachea normal.  Cardiovascular:     Rate and  Rhythm: Normal rate and regular rhythm.     Pulses: Normal pulses.     Heart sounds: Normal heart sounds, S1 normal and S2 normal. No murmur heard.    No friction rub. No gallop.  Pulmonary:     Effort: Pulmonary effort is normal. No tachypnea or respiratory distress.     Breath sounds: Normal breath sounds. No decreased breath sounds, wheezing, rhonchi or rales.  Abdominal:     General: Bowel sounds are normal.     Palpations: Abdomen is soft.     Tenderness: There is no abdominal tenderness.  Musculoskeletal:     Cervical back: Normal range of motion and neck supple.  Skin:    General: Skin is warm and dry.     Findings: No rash.  Neurological:     Mental Status: She is alert.  Psychiatric:        Mood and Affect: Mood is not anxious or depressed.        Speech: Speech normal.        Behavior: Behavior normal. Behavior is cooperative.        Thought Content: Thought content normal.        Judgment: Judgment normal.       Results for orders placed or performed in visit on 08/22/23  POCT rapid strep A   Collection Time: 08/22/23 10:42 AM  Result Value Ref Range   Rapid Strep A Screen Positive (A) Negative    Assessment and Plan The patient's preventative maintenance and recommended screening tests for an annual wellness exam were reviewed in full today. Brought up to date unless services declined.  Counselled on the importance of diet, exercise, and its role in overall health and mortality. The patient's FH and SH was reviewed, including their home life, tobacco status, and drug and alcohol status.       Vaccines:  COVID x 3, due for Td, shingrix  uptodate and flu given today  Nonsmoker  Colon: 2020 Dr.Vanga, normal.. no hemorrhoids seen ,  due in 10 years  Mammo  03/26/22 scheduled for mammogram  Pap/DVE at [facility] 03/2019 HIV: refused.     Routine general medical examination at a health care facility  Pure hypercholesterolemia -     Lipid panel -     Comprehensive  metabolic panel with GFR  Screening for HIV (human immunodeficiency virus)  Strep throat Assessment & Plan: Acute, treat with amoxicillin  500 mg 2 tablets twice daily x 10 days.  Encourage p.o. intake.  Can use ibuprofen 600 to 800 mg every 6-8 hours as needed for sore  throat pain or fever.  Return and ER precautions provided.  She will call if fever on antibiotics or symptoms worsening/not improving as expected.  Orders: -     POCT rapid strep A  Other orders -     Amoxicillin ; Take 2 capsules (1,000 mg total) by mouth 2 (two) times daily.  Dispense: 40 capsule; Refill: 0     Routine general medical examination at a health care facility  Pure hypercholesterolemia -     Lipid panel -     Comprehensive metabolic panel with GFR  Screening for HIV (human immunodeficiency virus)  Strep throat Assessment & Plan: Acute, treat with amoxicillin  500 mg 2 tablets twice daily x 10 days.  Encourage p.o. intake.  Can use ibuprofen 600 to 800 mg every 6-8 hours as needed for sore throat pain or fever.  Return and ER precautions provided.  She will call if fever on antibiotics or symptoms worsening/not improving as expected.  Orders: -     POCT rapid strep A  Other orders -     Amoxicillin ; Take 2 capsules (1,000 mg total) by mouth 2 (two) times daily.  Dispense: 40 capsule; Refill: 0     Return in about 1 year (around 08/21/2024) for annual physical with fasting labs prior.   Greig Ring, MD

## 2023-08-22 NOTE — Assessment & Plan Note (Signed)
 Acute, treat with amoxicillin  500 mg 2 tablets twice daily x 10 days.  Encourage p.o. intake.  Can use ibuprofen 600 to 800 mg every 6-8 hours as needed for sore throat pain or fever.  Return and ER precautions provided.  She will call if fever on antibiotics or symptoms worsening/not improving as expected.

## 2023-08-28 ENCOUNTER — Ambulatory Visit: Payer: Self-pay | Admitting: Family Medicine
